# Patient Record
Sex: Female | Born: 1965 | Race: White | Hispanic: No | Marital: Married | Smoking: Never smoker
Health system: Southern US, Community
[De-identification: ages and names within clinical notes are randomized; demographics above are authoritative.]

## PROBLEM LIST (undated history)

## (undated) DIAGNOSIS — M1990 Unspecified osteoarthritis, unspecified site: Secondary | ICD-10-CM

## (undated) DIAGNOSIS — Z9289 Personal history of other medical treatment: Secondary | ICD-10-CM

## (undated) DIAGNOSIS — D509 Iron deficiency anemia, unspecified: Secondary | ICD-10-CM

## (undated) DIAGNOSIS — R0989 Other specified symptoms and signs involving the circulatory and respiratory systems: Secondary | ICD-10-CM

## (undated) DIAGNOSIS — R002 Palpitations: Secondary | ICD-10-CM

## (undated) DIAGNOSIS — Z91148 Patient's other noncompliance with medication regimen for other reason: Secondary | ICD-10-CM

## (undated) DIAGNOSIS — E785 Hyperlipidemia, unspecified: Secondary | ICD-10-CM

## (undated) DIAGNOSIS — I10 Essential (primary) hypertension: Secondary | ICD-10-CM

## (undated) DIAGNOSIS — Z9114 Patient's other noncompliance with medication regimen: Secondary | ICD-10-CM

## (undated) DIAGNOSIS — E669 Obesity, unspecified: Secondary | ICD-10-CM

## (undated) HISTORY — DX: Palpitations: R00.2

## (undated) HISTORY — DX: Patient's other noncompliance with medication regimen for other reason: Z91.148

## (undated) HISTORY — DX: Unspecified osteoarthritis, unspecified site: M19.90

## (undated) HISTORY — DX: Hyperlipidemia, unspecified: E78.5

## (undated) HISTORY — DX: Other specified symptoms and signs involving the circulatory and respiratory systems: R09.89

## (undated) HISTORY — PX: WISDOM TOOTH EXTRACTION: SHX21

## (undated) HISTORY — DX: Obesity, unspecified: E66.9

## (undated) HISTORY — DX: Patient's other noncompliance with medication regimen: Z91.14

## (undated) HISTORY — DX: Personal history of other medical treatment: Z92.89

## (undated) HISTORY — DX: Iron deficiency anemia, unspecified: D50.9

## (undated) HISTORY — DX: Essential (primary) hypertension: I10

---

## 1999-03-20 ENCOUNTER — Other Ambulatory Visit: Admission: RE | Admit: 1999-03-20 | Discharge: 1999-03-20 | Payer: Self-pay | Admitting: Family Medicine

## 1999-10-07 ENCOUNTER — Ambulatory Visit (HOSPITAL_COMMUNITY): Admission: RE | Admit: 1999-10-07 | Discharge: 1999-10-07 | Payer: Self-pay | Admitting: Family Medicine

## 1999-10-07 ENCOUNTER — Encounter: Payer: Self-pay | Admitting: Family Medicine

## 1999-12-04 ENCOUNTER — Encounter: Payer: Self-pay | Admitting: Family Medicine

## 1999-12-04 ENCOUNTER — Ambulatory Visit (HOSPITAL_COMMUNITY): Admission: RE | Admit: 1999-12-04 | Discharge: 1999-12-04 | Payer: Self-pay | Admitting: Family Medicine

## 1999-12-24 ENCOUNTER — Ambulatory Visit (HOSPITAL_COMMUNITY): Admission: RE | Admit: 1999-12-24 | Discharge: 1999-12-24 | Payer: Self-pay | Admitting: Neurosurgery

## 1999-12-24 ENCOUNTER — Encounter: Payer: Self-pay | Admitting: Neurosurgery

## 2000-01-08 ENCOUNTER — Ambulatory Visit (HOSPITAL_COMMUNITY): Admission: RE | Admit: 2000-01-08 | Discharge: 2000-01-08 | Payer: Self-pay | Admitting: Neurosurgery

## 2000-10-07 ENCOUNTER — Other Ambulatory Visit: Admission: RE | Admit: 2000-10-07 | Discharge: 2000-10-07 | Payer: Self-pay | Admitting: Family Medicine

## 2000-10-14 ENCOUNTER — Encounter: Admission: RE | Admit: 2000-10-14 | Discharge: 2000-10-14 | Payer: Self-pay | Admitting: Family Medicine

## 2000-10-14 ENCOUNTER — Encounter: Payer: Self-pay | Admitting: Family Medicine

## 2001-10-07 ENCOUNTER — Other Ambulatory Visit: Admission: RE | Admit: 2001-10-07 | Discharge: 2001-10-07 | Payer: Self-pay | Admitting: Family Medicine

## 2001-11-15 ENCOUNTER — Ambulatory Visit (HOSPITAL_COMMUNITY): Admission: RE | Admit: 2001-11-15 | Discharge: 2001-11-15 | Payer: Self-pay | Admitting: Neurosurgery

## 2001-11-15 ENCOUNTER — Encounter: Payer: Self-pay | Admitting: Neurosurgery

## 2002-10-11 ENCOUNTER — Other Ambulatory Visit: Admission: RE | Admit: 2002-10-11 | Discharge: 2002-10-11 | Payer: Self-pay | Admitting: Family Medicine

## 2003-07-25 ENCOUNTER — Ambulatory Visit (HOSPITAL_COMMUNITY): Admission: RE | Admit: 2003-07-25 | Discharge: 2003-07-25 | Payer: Self-pay | Admitting: Family Medicine

## 2003-08-05 DIAGNOSIS — E669 Obesity, unspecified: Secondary | ICD-10-CM

## 2003-08-05 DIAGNOSIS — Z9289 Personal history of other medical treatment: Secondary | ICD-10-CM

## 2003-08-05 HISTORY — DX: Obesity, unspecified: E66.9

## 2003-08-05 HISTORY — DX: Personal history of other medical treatment: Z92.89

## 2003-11-07 ENCOUNTER — Other Ambulatory Visit: Admission: RE | Admit: 2003-11-07 | Discharge: 2003-11-07 | Payer: Self-pay | Admitting: Family Medicine

## 2004-07-09 ENCOUNTER — Ambulatory Visit: Payer: Self-pay | Admitting: Family Medicine

## 2004-08-04 DIAGNOSIS — Z9289 Personal history of other medical treatment: Secondary | ICD-10-CM

## 2004-08-04 HISTORY — DX: Personal history of other medical treatment: Z92.89

## 2006-11-02 ENCOUNTER — Ambulatory Visit (HOSPITAL_COMMUNITY): Admission: RE | Admit: 2006-11-02 | Discharge: 2006-11-02 | Payer: Self-pay | Admitting: Family Medicine

## 2006-12-09 ENCOUNTER — Encounter: Admission: RE | Admit: 2006-12-09 | Discharge: 2006-12-09 | Payer: Self-pay | Admitting: Family Medicine

## 2008-03-19 IMAGING — CT CT ABD/PELVIS
3 of 6 series · 17 of 46 positions shown, 19 images · IV contrast (READICAT/WATER & [ID] OMNI 300)
Comparison: none

CLINICAL DATA: Severe abdominal pain.   Back pain. 

 ABDOMEN CT WITHOUT AND WITH CONTRAST:
TECHNIQUE: Multidetector CT imaging of the abdomen was performed both before and during bolus administration of intravenous contrast.
 Contrast:  100 cc Omnipaque 300
TECHNIQUE: Multidetector CT imaging of the pelvis was performed following the standard protocol during bolus administration of intravenous contrast.

[Series 3: abdomen w/ · axial · 0.82mm/px · z∈[-375,+5]mm · 12 of 90 slices shown, 14 images]
[im 7/90  soft-tissue]
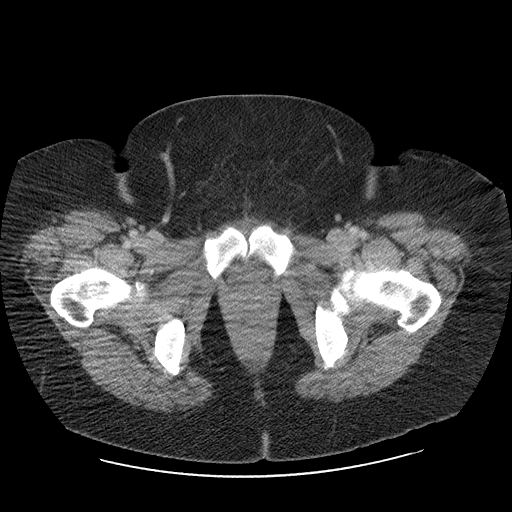
[im 7/90  bone]
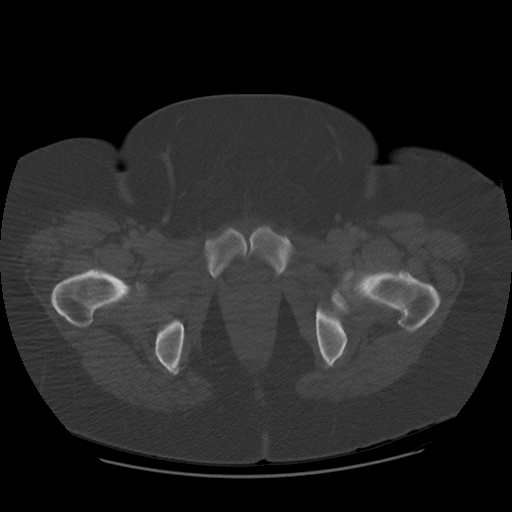
[im 14/90  soft-tissue]
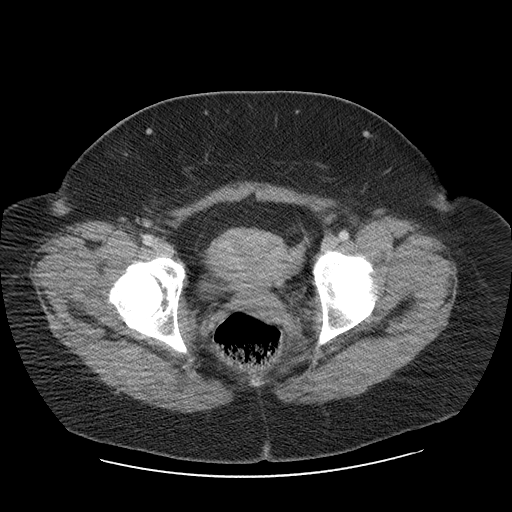
[im 21/90  soft-tissue]
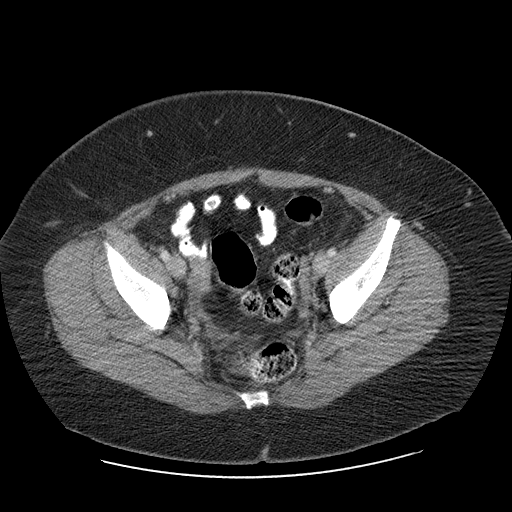
[im 28/90  soft-tissue]
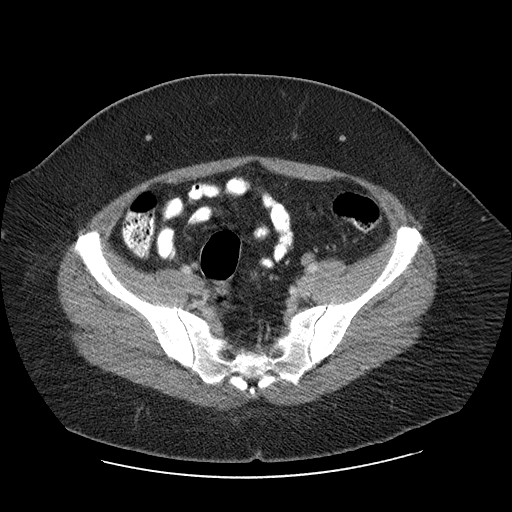
[im 35/90  soft-tissue]
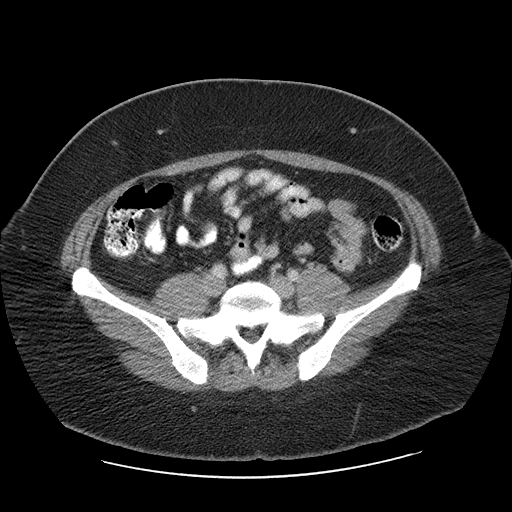
[im 42/90  soft-tissue]
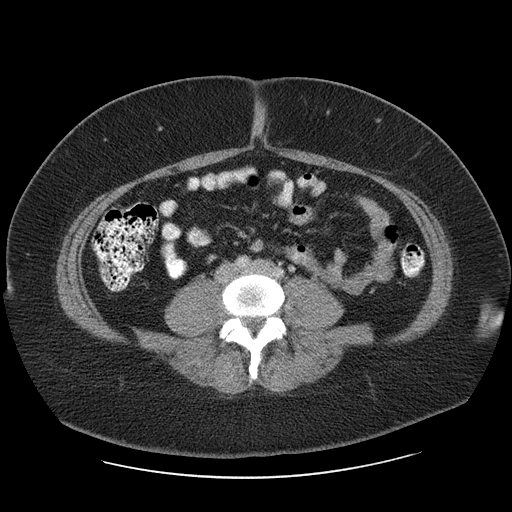
[im 48/90  soft-tissue]
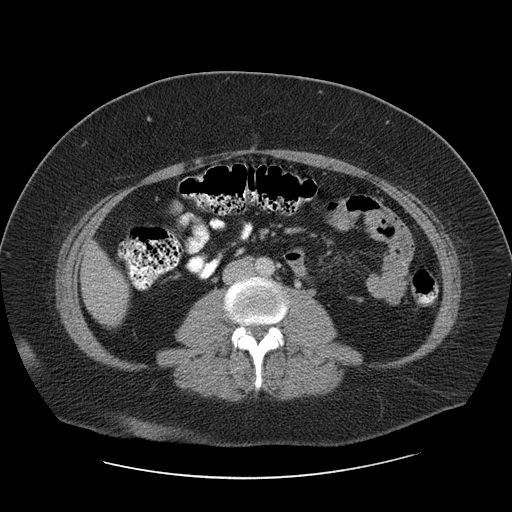
[im 55/90  soft-tissue]
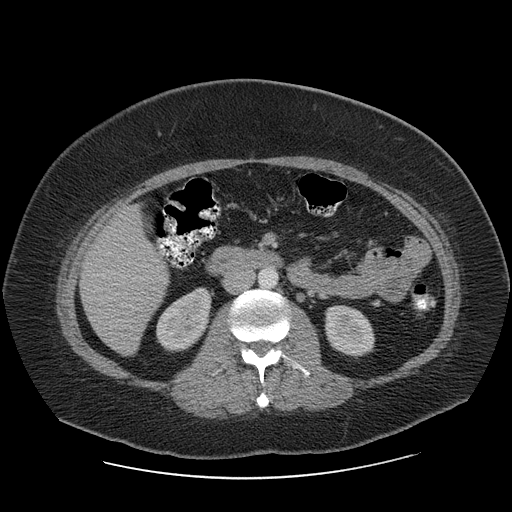
[im 62/90  soft-tissue]
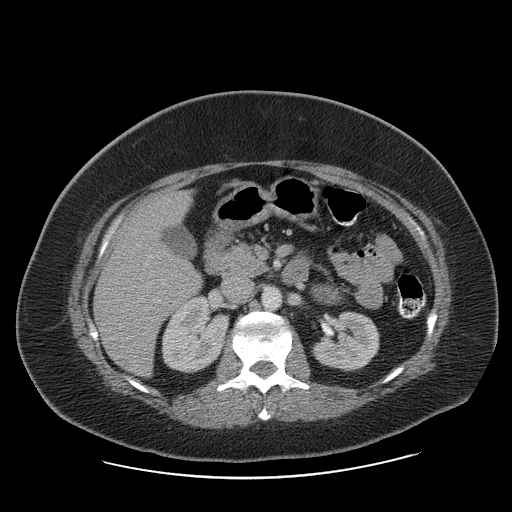
[im 62/90  bone]
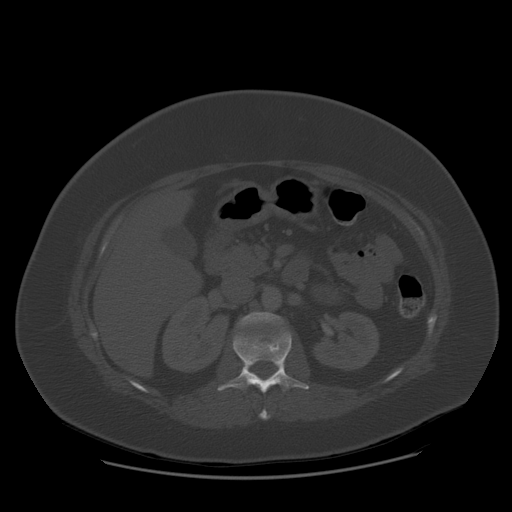
[im 69/90  soft-tissue]
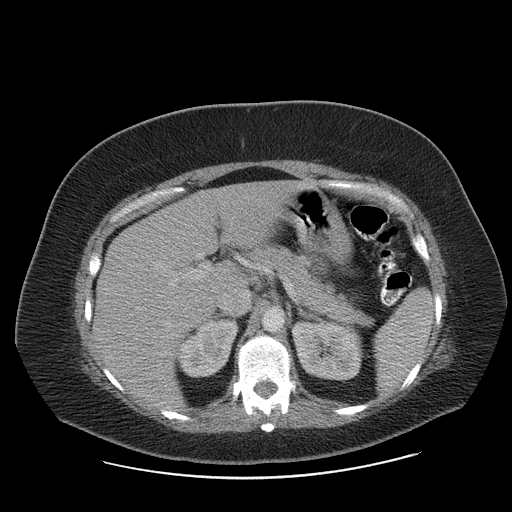
[im 76/90  soft-tissue]
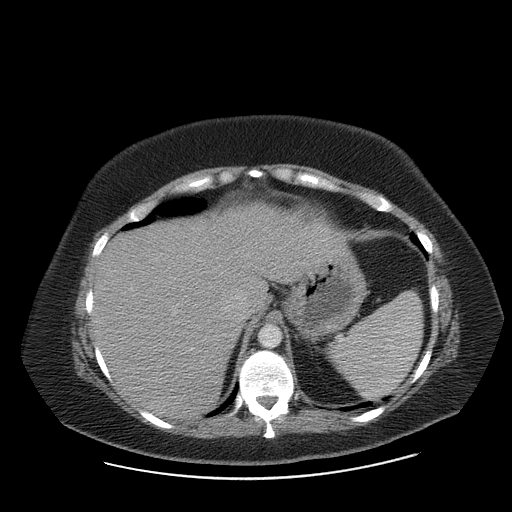
[im 83/90  soft-tissue]
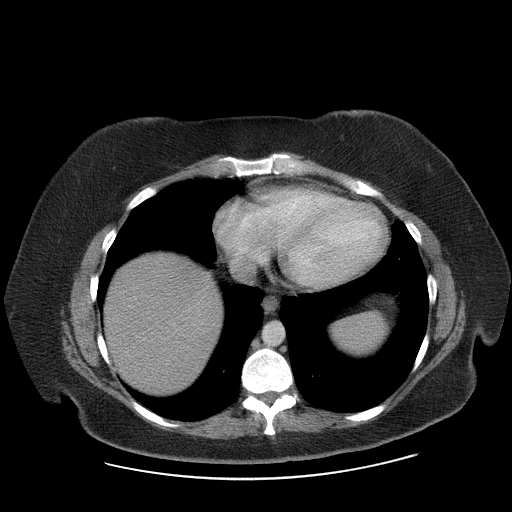

[Series 4: lung windows · axial · 0.82mm/px · z∈[-80,-40]mm · 2 of 32 slices shown]
[im 8/32  bone]
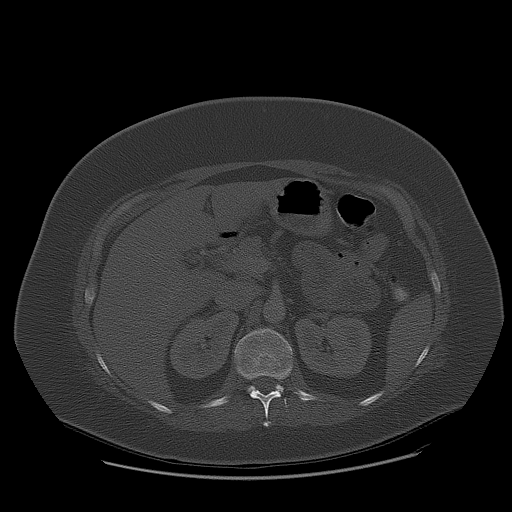
[im 16/32  bone]
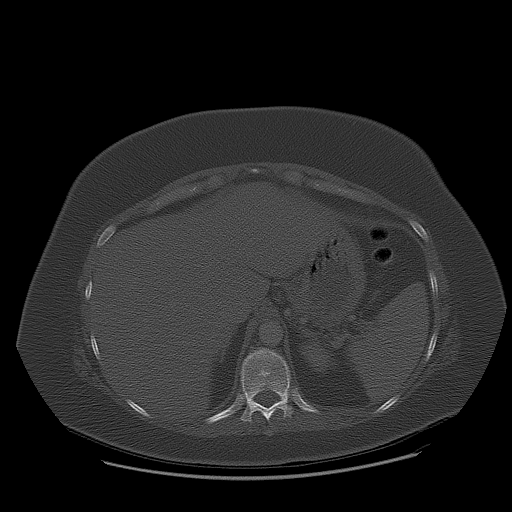

[Series 500: coronal · coronal · 0.92mm/px · 3 of 127 slices shown]
[im 43/127  soft-tissue]
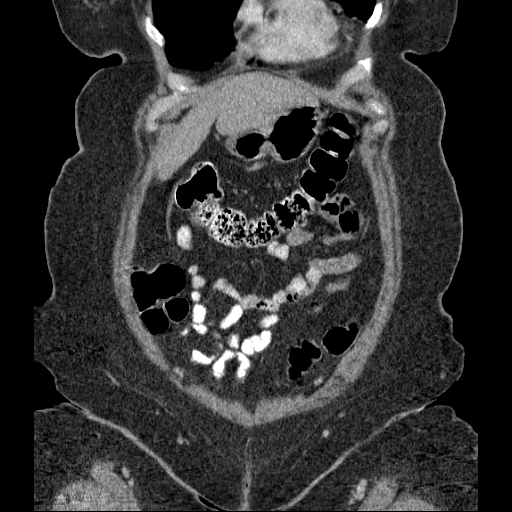
[im 57/127  soft-tissue]
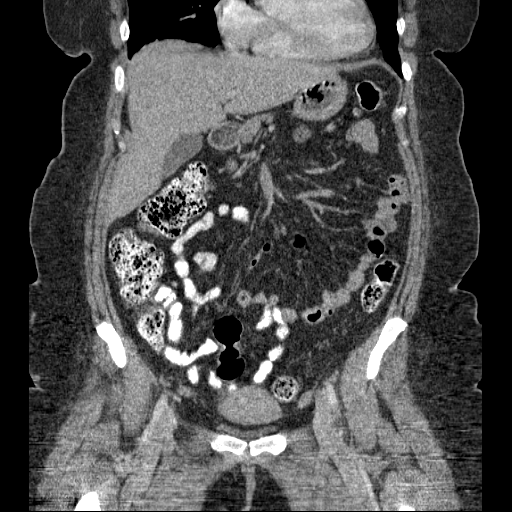
[im 71/127  soft-tissue]
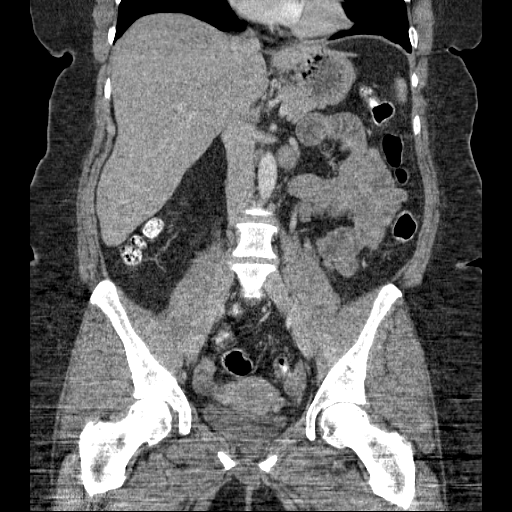

[17 of 46 positions shown; findings below may reference images not displayed]

FINDINGS: The liver, gallbladder, adrenal glands, kidneys, spleen, pancreas, stomach and small bowel are unremarkable.  No pathologically enlarged lymph nodes.  No free fluid.
IMPRESSION: No acute findings. 

 PELVIS CT WITH CONTRAST:
FINDINGS: The uterus and ovaries are visualized.  Colon unremarkable.  No pathologically enlarged lymph nodes.  No free fluid.  No worrisome lytic or sclerotic lesion.
IMPRESSION: No acute findings.

## 2008-08-04 DIAGNOSIS — D509 Iron deficiency anemia, unspecified: Secondary | ICD-10-CM

## 2008-08-04 HISTORY — DX: Iron deficiency anemia, unspecified: D50.9

## 2011-10-03 DIAGNOSIS — R0989 Other specified symptoms and signs involving the circulatory and respiratory systems: Secondary | ICD-10-CM

## 2011-10-03 HISTORY — DX: Other specified symptoms and signs involving the circulatory and respiratory systems: R09.89

## 2013-04-18 ENCOUNTER — Encounter: Payer: Self-pay | Admitting: *Deleted

## 2013-04-25 ENCOUNTER — Ambulatory Visit: Payer: Self-pay | Admitting: Cardiology

## 2013-05-17 ENCOUNTER — Encounter: Payer: Self-pay | Admitting: Cardiovascular Disease

## 2013-05-27 ENCOUNTER — Other Ambulatory Visit: Payer: Self-pay

## 2013-05-27 ENCOUNTER — Other Ambulatory Visit: Payer: Self-pay | Admitting: Cardiovascular Disease

## 2013-05-27 ENCOUNTER — Telehealth: Payer: Self-pay | Admitting: Cardiovascular Disease

## 2013-05-27 MED ORDER — LISINOPRIL 20 MG PO TABS: ORAL_TABLET | ORAL | Status: DC

## 2013-05-27 MED ORDER — OMEPRAZOLE 20 MG PO CPDR: DELAYED_RELEASE_CAPSULE | ORAL | Status: DC

## 2013-05-27 MED ORDER — SIMVASTATIN 40 MG PO TABS: ORAL_TABLET | ORAL | Status: DC

## 2013-05-27 NOTE — Telephone Encounter (Signed)
Rx was sent to pharmacy electronically. 

## 2013-05-27 NOTE — Telephone Encounter (Signed)
Returned call.  Left message asking that hardy copy request be faxed to [phone] for refills or send electronically. 

## 2013-05-27 NOTE — Telephone Encounter (Signed)
Need refills on Lisinipril 20 mg #30,Simvastatin 40 mg #30 and Omeprazole 20 mg #30.

## 2013-06-01 ENCOUNTER — Ambulatory Visit (INDEPENDENT_AMBULATORY_CARE_PROVIDER_SITE_OTHER): Payer: BC Managed Care – PPO | Admitting: Cardiology

## 2013-06-01 ENCOUNTER — Encounter: Payer: Self-pay | Admitting: Cardiology

## 2013-06-01 VITALS — BP 140/86 | HR 90 | Ht 62.0 in | Wt 267.0 lb

## 2013-06-01 DIAGNOSIS — R002 Palpitations: Secondary | ICD-10-CM | POA: Insufficient documentation

## 2013-06-01 DIAGNOSIS — E669 Obesity, unspecified: Secondary | ICD-10-CM

## 2013-06-01 DIAGNOSIS — I10 Essential (primary) hypertension: Secondary | ICD-10-CM

## 2013-06-01 DIAGNOSIS — D509 Iron deficiency anemia, unspecified: Secondary | ICD-10-CM

## 2013-06-01 DIAGNOSIS — Z9114 Patient's other noncompliance with medication regimen: Secondary | ICD-10-CM

## 2013-06-01 DIAGNOSIS — R0989 Other specified symptoms and signs involving the circulatory and respiratory systems: Secondary | ICD-10-CM

## 2013-06-01 DIAGNOSIS — E785 Hyperlipidemia, unspecified: Secondary | ICD-10-CM

## 2013-06-01 DIAGNOSIS — Z9119 Patient's noncompliance with other medical treatment and regimen: Secondary | ICD-10-CM

## 2013-06-01 DIAGNOSIS — Z9289 Personal history of other medical treatment: Secondary | ICD-10-CM | POA: Insufficient documentation

## 2013-06-01 DIAGNOSIS — Z9189 Other specified personal risk factors, not elsewhere classified: Secondary | ICD-10-CM

## 2013-06-01 DIAGNOSIS — M1990 Unspecified osteoarthritis, unspecified site: Secondary | ICD-10-CM

## 2013-06-01 DIAGNOSIS — Z91148 Patient's other noncompliance with medication regimen for other reason: Secondary | ICD-10-CM

## 2013-06-01 DIAGNOSIS — Z789 Other specified health status: Secondary | ICD-10-CM

## 2013-06-01 MED ORDER — LISINOPRIL 20 MG PO TABS: ORAL_TABLET | ORAL | Status: DC

## 2013-06-01 MED ORDER — POLYSACCHARIDE IRON COMPLEX 150 MG PO CAPS: 150.0000 mg | ORAL_CAPSULE | Freq: Every day | ORAL | Status: DC

## 2013-06-01 MED ORDER — SIMVASTATIN 40 MG PO TABS: ORAL_TABLET | ORAL | Status: DC

## 2013-06-01 MED ORDER — OMEPRAZOLE 20 MG PO CPDR: DELAYED_RELEASE_CAPSULE | ORAL | Status: DC

## 2013-06-01 MED ORDER — METOPROLOL TARTRATE 25 MG PO TABS: 25.0000 mg | ORAL_TABLET | Freq: Two times a day (BID) | ORAL | Status: DC

## 2013-06-01 MED ORDER — HYDROCHLOROTHIAZIDE 12.5 MG PO CAPS: 12.5000 mg | ORAL_CAPSULE | Freq: Every day | ORAL | Status: DC

## 2013-06-01 NOTE — Assessment & Plan Note (Signed)
She is scheduled to see Dr Loreta Ave

## 2013-06-01 NOTE — Assessment & Plan Note (Signed)
Doing well except for Nu Iron which she only gets every other month

## 2013-06-01 NOTE — Assessment & Plan Note (Signed)
Controlled.  

## 2013-06-01 NOTE — Assessment & Plan Note (Signed)
No significant problems with this

## 2013-06-01 NOTE — Progress Notes (Signed)
06/01/2013 Shelia Clements   [birth year]  [id number]  Primary Physicia No PCP Per Patient Primary Cardiologist: Shelia Clements  HPI:  The patient is a pleasant, 47 year old female who we have seen in the past for hypertension and palpitations as well as dyslipidemia. She has had a low risk Myoview in 2006, and a normal echocardiogram in 2005 . She had a negative sleep study in 2005 but are suspicious she probably has sleep apnea as her BMI is 48. We saw her in the office September 10, 2012, for palpitations and hypertension. She has had problems with higher doses of Coreg in the past. I stopped her Coreg and put her on metoprolol 25 mg b.i.d. and added HCTZ 12.5 mg daily to her current medications. Lab work done Feb 2014 showed her renal function to be normal with a BUN of 12, a creatinine of 0.69, potassium 4.7. TSH is 4.63 which is just outside the high range. The lipid panel shows a cholesterol 142, HDL 47 and LDL 75. Interestingly, her hematology profile showed a hemoglobin of 8.4 and hematocrit of 31. Her MCV was 61. I reviewed her previous records with her and she apparently had a GI evaluation by Shelia. Loreta Clements a couple years ago for iron deficiency anemia. In 2010 her hemoglobin was 11.4. A colonoscopy and endoscopy were recommended but she could not afford the copay and never had that done. She was put on iron supplement but apparently had a reaction to ferrous sulfate and this was changed to Nu-Iron. She could tolerate the Nu-Iron but could not afford the copay.       She is here today for a 6 month follow up. She has done well from a cardiac standpoint with rare palpitations. She is able to get her medications except for the Nu Iron which she takes every other month because of cost. She works at [employer].           Current Outpatient Prescriptions  Medication Sig Dispense Refill  . hydrochlorothiazide (MICROZIDE) 12.5 MG capsule Take 1 capsule (12.5 mg total) by mouth daily.  30 capsule  6  .  iron polysaccharides (NU-IRON) 150 MG capsule Take 1 capsule (150 mg total) by mouth daily.  30 capsule  6  . lisinopril (PRINIVIL,ZESTRIL) 20 MG tablet Take 1 tablet (20 mg total) by mouth once daily.  30 tablet  6  . metoprolol tartrate (LOPRESSOR) 25 MG tablet Take 1 tablet (25 mg total) by mouth 2 (two) times daily.  30 tablet  6  . omeprazole (PRILOSEC) 20 MG capsule Take 1 capsule (20 mg total) by mouth daily.  30 capsule  6  . simvastatin (ZOCOR) 40 MG tablet Take 1 tablet (40 mg total) by mouth at bedtime.  30 tablet  0   No current facility-administered medications for this visit.    Allergies  Allergen Reactions  . Ferrous Sulfate   . Penicillins   . Sulfa Antibiotics     History   Social History  . Marital Status: Single    Spouse Name: N/A    Number of Children: N/A  . Years of Education: N/A   Occupational History  . Not on file.   Social History Main Topics  . Smoking status: Never Smoker   . Smokeless tobacco: Not on file  . Alcohol Use: Not on file  . Drug Use: Not on file  . Sexual Activity: Not on file   Other Topics Concern  . Not on file  Social History Narrative  . No narrative on file     Review of Systems: General: negative for chills, fever, night sweats or weight changes.  Cardiovascular: negative for chest pain, dyspnea on exertion, edema, orthopnea, palpitations, paroxysmal nocturnal dyspnea or shortness of breath Dermatological: negative for rash Respiratory: negative for cough or wheezing Urologic: negative for hematuria Abdominal: negative for nausea, vomiting, diarrhea, bright red blood per rectum, melena, or hematemesis Neurologic: negative for visual changes, syncope, or dizziness All other systems reviewed and are otherwise negative except as noted above.    Blood pressure 140/86, pulse 90, height 5\' 2"  (1.575 m), weight 267 lb (121.11 kg).  General appearance: alert, cooperative, no distress and morbidly obese Lungs: clear to  auscultation bilaterally Heart: regular rate and rhythm Neck- soft carotid bruit on Lt  EKG NSR  ASSESSMENT AND PLAN:   Hypertension Controlled  Palpitation No significant problems with this  Dyslipidemia Due for lipids  Obesity- BMI 48 We again discussed the importance of wgt loss and wgt loss strategies.  Iron deficiency anemia She is scheduled to see Shelia Clements  Noncompliance with medications secondary to costs Doing well except for Nu Iron which she only gets every other month   PLAN  We had a discussion about her weight. She drinks milk "like water" and admits to "binge eating". She knows she needs to walk for exercise but is not regular about this. She would benefit from an intense weight loss program but I doubt she would agree to this. I'll bring it up again at her next visit. I did order labs including a Hgb A1c (obesity, family history of DM), a follow up TSH, CMET, and CBC. We'll see her back in 6 months or sooner if there is a problem with her labs. She has an appointment with Shelia Clements and I encouraged her to keep this.  Shelia Clements KPA-C 06/01/2013 3:05 PM

## 2013-06-01 NOTE — Assessment & Plan Note (Signed)
We again discussed the importance of wgt loss and wgt loss strategies.

## 2013-06-01 NOTE — Assessment & Plan Note (Signed)
Due for lipids.  

## 2013-06-01 NOTE — Patient Instructions (Signed)
Your physician recommends that you return for lab work, please fast before lab Your physician recommends that you schedule a follow-up appointment in: 6  months

## 2013-06-10 ENCOUNTER — Telehealth: Payer: Self-pay | Admitting: Cardiology

## 2013-06-10 NOTE — Telephone Encounter (Signed)
Patient called back and states that se spoke with [facility] and was told that it would be better if a nurse called for her lab results.  We need to call customer service @ [phone].

## 2013-06-10 NOTE — Telephone Encounter (Signed)
Would like lab results from Monday(06-06-13)please.

## 2013-06-10 NOTE — Telephone Encounter (Signed)
Returned call and spoke w/ pt.  Informed results have not been received.  Pt stated she went to [facility] in [place].  Pt informed there may be a delay w/ resulting or they faxed results.  Informed RN will notify Medical Records to check for lab results so provider can be notified.  Pt verbalized understanding and agreed w/ plan.  Pt will also call [facility] to make sure results faxed.  Medical Records notified to look for results.

## 2013-06-13 NOTE — Telephone Encounter (Signed)
Call to [facility] and informed results just reported and will fax now.  MR notified to scan results into [facility] Shelter, PA-C[facility] [place] when received.  Returned call.  No answer/voicemail.  Will try later.

## 2013-06-13 NOTE — Telephone Encounter (Signed)
Returning Amber call

## 2013-06-13 NOTE — Telephone Encounter (Signed)
Returned call and pt informed [facility] faxed results this AM and she will be notified once reviewed by PA.  Pt verbalized understanding and agreed w/ plan.

## 2013-06-15 LAB — TSH: TSH: 3.42 u[IU]/mL (ref 0.450–4.500)

## 2013-06-15 LAB — COMPREHENSIVE METABOLIC PANEL
ALT: 13 IU/L (ref 0–32)
AST: 13 IU/L (ref 0–40)
Albumin/Globulin Ratio: 1.4 (ref 1.1–2.5)
Albumin: 4 g/dL (ref 3.5–5.5)
Alkaline Phosphatase: 100 IU/L (ref 39–117)
BUN/Creatinine Ratio: 21 (ref 9–23)
BUN: 13 mg/dL (ref 6–24)
CO2: 23 mmol/L (ref 18–29)
Calcium: 9.5 mg/dL (ref 8.7–10.2)
Chloride: 101 mmol/L (ref 97–108)
Creatinine, Ser: 0.63 mg/dL (ref 0.57–1.00)
GFR calc Af Amer: 125 mL/min/{1.73_m2} (ref >59)
GFR calc non Af Amer: 109 mL/min/{1.73_m2} (ref >59)
Globulin, Total: 2.8 g/dL (ref 1.5–4.5)
Glucose: 109 mg/dL — ABNORMAL HIGH (ref 65–99)
Potassium: 4.9 mmol/L (ref 3.5–5.2)
Sodium: 140 mmol/L (ref 134–144)
Total Bilirubin: 0.3 mg/dL (ref 0.0–1.2)
Total Protein: 6.8 g/dL (ref 6.0–8.5)

## 2013-06-15 LAB — LIPID PANEL W/O CHOL/HDL RATIO
Cholesterol, Total: 150 mg/dL (ref 100–199)
HDL: 46 mg/dL (ref >39)
LDL Calculated: 77 mg/dL (ref 0–99)
Triglycerides: 137 mg/dL (ref 0–149)
VLDL Cholesterol Cal: 27 mg/dL (ref 5–40)

## 2013-06-16 ENCOUNTER — Telehealth: Payer: Self-pay | Admitting: Cardiovascular Disease

## 2013-06-16 DIAGNOSIS — R7309 Other abnormal glucose: Secondary | ICD-10-CM

## 2013-06-16 DIAGNOSIS — Z79899 Other long term (current) drug therapy: Secondary | ICD-10-CM

## 2013-06-16 NOTE — Progress Notes (Signed)
Called no answer

## 2013-06-16 NOTE — Telephone Encounter (Signed)
Returned call and pt verified x 2.  Stated someone called her this morning and she thinks it was about her lab results.  Chart reviewed and documentation of call not found, but results reviewed.  Pt informed of results per PA and verbalized understanding.  Pt concerned her blood count was normal b/c she feels tired all of the time.  Pt informed blood count was not drawn per results.  Reviewed OV note and PA ordered CBC w/ diff and Hgb A1C, neither drawn.  Labs reordered and faxed to [facility] in [place] ([address]).  Pt will go Monday to have labs drawn.  Message forwarded to L. Diona Fanti, PA-C (FYI).

## 2013-06-16 NOTE — Telephone Encounter (Signed)
Returning someone's call from this morning  Not sure who called  Not sure what is about unless its labs  Please return call

## 2013-06-20 NOTE — Progress Notes (Signed)
Pt. Given lab results

## 2013-06-28 ENCOUNTER — Telehealth: Payer: Self-pay | Admitting: Cardiology

## 2013-06-28 DIAGNOSIS — R7309 Other abnormal glucose: Secondary | ICD-10-CM

## 2013-06-28 DIAGNOSIS — Z79899 Other long term (current) drug therapy: Secondary | ICD-10-CM

## 2013-06-28 NOTE — Telephone Encounter (Signed)
Returned call and pt verified x 2.  Pt stated [facility] told her their policy changed and she will have to pay money down for tests in case it's not covered by her insurance or put a credit card on file.  Pt stated she doesn't have the money and doesn't have a credit card.  Pt wants to go to [facility] to have labs drawn.  Informed labs will be reordered for [facility] and she will need to present to lab to have drawn.  Returned call and pt verified x 2.

## 2013-06-28 NOTE — Telephone Encounter (Signed)
Pt wants to talk to Amber-co.ncerning her lab work

## 2013-08-01 ENCOUNTER — Telehealth: Payer: Self-pay | Admitting: Cardiovascular Disease

## 2013-08-01 MED ORDER — SIMVASTATIN 40 MG PO TABS: ORAL_TABLET | ORAL | Status: DC

## 2013-08-01 NOTE — Telephone Encounter (Signed)
Refill request not received.    Returned call and pt verified x 2.  Pt informed message received and request not.  Informed RN will send in refill to last until April appt.  Pt verbalized understanding and agreed w/ plan.  Refill(s) sent to pharmacy.

## 2013-08-01 NOTE — Telephone Encounter (Signed)
Trying to get Simistatin 40mg  filled  [facility] in [place] has submitted twice and no response  Needs to get refilled.  Please call her before 10am or after 3 pm to reach her

## 2013-11-14 ENCOUNTER — Other Ambulatory Visit: Payer: Self-pay | Admitting: *Deleted

## 2013-11-14 MED ORDER — METOPROLOL TARTRATE 25 MG PO TABS: 25.0000 mg | ORAL_TABLET | Freq: Two times a day (BID) | ORAL | Status: DC

## 2013-11-30 ENCOUNTER — Telehealth: Payer: Self-pay | Admitting: Cardiovascular Disease

## 2013-11-30 MED ORDER — LISINOPRIL 20 MG PO TABS: ORAL_TABLET | ORAL | Status: DC

## 2013-11-30 MED ORDER — METOPROLOL TARTRATE 25 MG PO TABS: 25.0000 mg | ORAL_TABLET | Freq: Two times a day (BID) | ORAL | Status: DC

## 2013-11-30 MED ORDER — SIMVASTATIN 40 MG PO TABS: ORAL_TABLET | ORAL | Status: DC

## 2013-11-30 NOTE — Telephone Encounter (Signed)
Patient notified that refills are sent to pharmacy

## 2013-11-30 NOTE — Telephone Encounter (Signed)
Appt with luke on 5/26.  Needs refills on lisinopril and simvastatin to last til then.Marland Kitchen.Marland Kitchen.Also needs to ask about her Metoprolol.  DIdn't get enough when refilled last time., [facility] in [place] - [phone].

## 2013-12-27 ENCOUNTER — Encounter: Payer: Self-pay | Admitting: Cardiology

## 2013-12-27 ENCOUNTER — Ambulatory Visit (INDEPENDENT_AMBULATORY_CARE_PROVIDER_SITE_OTHER): Payer: BC Managed Care – PPO | Admitting: Cardiology

## 2013-12-27 VITALS — BP 140/86 | HR 81 | Ht 62.0 in | Wt 257.9 lb

## 2013-12-27 DIAGNOSIS — I10 Essential (primary) hypertension: Secondary | ICD-10-CM

## 2013-12-27 DIAGNOSIS — Z9114 Patient's other noncompliance with medication regimen: Secondary | ICD-10-CM

## 2013-12-27 DIAGNOSIS — Z9119 Patient's noncompliance with other medical treatment and regimen: Secondary | ICD-10-CM

## 2013-12-27 DIAGNOSIS — E669 Obesity, unspecified: Secondary | ICD-10-CM

## 2013-12-27 DIAGNOSIS — Z91148 Patient's other noncompliance with medication regimen for other reason: Secondary | ICD-10-CM

## 2013-12-27 DIAGNOSIS — Z91199 Patient's noncompliance with other medical treatment and regimen due to unspecified reason: Secondary | ICD-10-CM

## 2013-12-27 DIAGNOSIS — D649 Anemia, unspecified: Secondary | ICD-10-CM

## 2013-12-27 DIAGNOSIS — Z79899 Other long term (current) drug therapy: Secondary | ICD-10-CM

## 2013-12-27 DIAGNOSIS — Z833 Family history of diabetes mellitus: Secondary | ICD-10-CM

## 2013-12-27 DIAGNOSIS — R0989 Other specified symptoms and signs involving the circulatory and respiratory systems: Secondary | ICD-10-CM

## 2013-12-27 DIAGNOSIS — D509 Iron deficiency anemia, unspecified: Secondary | ICD-10-CM

## 2013-12-27 DIAGNOSIS — R0600 Dyspnea, unspecified: Secondary | ICD-10-CM | POA: Insufficient documentation

## 2013-12-27 DIAGNOSIS — R0609 Other forms of dyspnea: Secondary | ICD-10-CM

## 2013-12-27 DIAGNOSIS — R002 Palpitations: Secondary | ICD-10-CM

## 2013-12-27 LAB — IRON: Iron: 12 ug/dL — ABNORMAL LOW (ref 42–145)

## 2013-12-27 LAB — CBC WITH DIFFERENTIAL/PLATELET
Basophils Absolute: 0 10*3/uL (ref 0.0–0.1)
Basophils Relative: 0 % (ref 0–1)
Eosinophils Absolute: 0.3 10*3/uL (ref 0.0–0.7)
Eosinophils Relative: 3 % (ref 0–5)
HCT: 26.6 % — ABNORMAL LOW (ref 36.0–46.0)
Hemoglobin: 7.3 g/dL — ABNORMAL LOW (ref 12.0–15.0)
Lymphocytes Relative: 23 % (ref 12–46)
Lymphs Abs: 2.1 10*3/uL (ref 0.7–4.0)
MCH: 15.8 pg — ABNORMAL LOW (ref 26.0–34.0)
MCHC: 27.4 g/dL — ABNORMAL LOW (ref 30.0–36.0)
MCV: 57.6 fL — ABNORMAL LOW (ref 78.0–100.0)
Monocytes Absolute: 0.7 10*3/uL (ref 0.1–1.0)
Monocytes Relative: 8 % (ref 3–12)
Neutro Abs: 6 10*3/uL (ref 1.7–7.7)
Neutrophils Relative %: 66 % (ref 43–77)
Platelets: 449 10*3/uL — ABNORMAL HIGH (ref 150–400)
RBC: 4.62 MIL/uL (ref 3.87–5.11)
RDW: 20.4 % — ABNORMAL HIGH (ref 11.5–15.5)
WBC: 9.1 10*3/uL (ref 4.0–10.5)

## 2013-12-27 LAB — BASIC METABOLIC PANEL
BUN: 10 mg/dL (ref 6–23)
CO2: 27 mEq/L (ref 19–32)
Calcium: 9.2 mg/dL (ref 8.4–10.5)
Chloride: 102 mEq/L (ref 96–112)
Creat: 0.57 mg/dL (ref 0.50–1.10)
Glucose, Bld: 88 mg/dL (ref 70–99)
Potassium: 5 mEq/L (ref 3.5–5.3)
Sodium: 138 mEq/L (ref 135–145)

## 2013-12-27 LAB — HEMOGLOBIN A1C
Hgb A1c MFr Bld: 6.6 % — ABNORMAL HIGH (ref <5.7)
Mean Plasma Glucose: 143 mg/dL — ABNORMAL HIGH (ref <117)

## 2013-12-27 MED ORDER — SIMVASTATIN 40 MG PO TABS: ORAL_TABLET | ORAL | Status: DC

## 2013-12-27 MED ORDER — POLYSACCHARIDE IRON COMPLEX 150 MG PO CAPS: 150.0000 mg | ORAL_CAPSULE | Freq: Every day | ORAL | Status: DC

## 2013-12-27 MED ORDER — METOPROLOL TARTRATE 25 MG PO TABS: 25.0000 mg | ORAL_TABLET | Freq: Two times a day (BID) | ORAL | Status: DC

## 2013-12-27 MED ORDER — OMEPRAZOLE 20 MG PO CPDR: DELAYED_RELEASE_CAPSULE | ORAL | Status: DC

## 2013-12-27 MED ORDER — LISINOPRIL 20 MG PO TABS: ORAL_TABLET | ORAL | Status: DC

## 2013-12-27 MED ORDER — HYDROCHLOROTHIAZIDE 12.5 MG PO CAPS: 12.5000 mg | ORAL_CAPSULE | ORAL | Status: DC | PRN

## 2013-12-27 NOTE — Progress Notes (Signed)
12/27/2013 Shelia Clements   May 20, 1966  [id number]  Primary Physicia No PCP Per Patient Primary Cardiologist: Dr Allyson SabalBerry   HPI:  The patient is a pleasant, 48 year old female who we have seen in the past for hypertension and palpitations as well as dyslipidemia. She has no primary MD. She has had problems with medication compliance because of cost. She had a low risk Myoview in 2006, and a normal echocardiogram in 2005. She had a negative sleep study in 2005 but we are suspicious she probably has sleep apnea as her BMI is 48. I reviewed her previous records with her and she apparently had a GI evaluation by Dr. Loreta AveMann a couple years ago for iron deficiency anemia. In 2010 her hemoglobin was 11.4. A colonoscopy and endoscopy were recommended but she could not afford the copay and never had that done. She was put on iron supplement but apparently had a reaction to ferrous sulfate and this was changed to Nu-Iron. She could tolerate the Nu-Iron but could not afford the copay. She is in the office for 6 month follow up. Last OV I encouraged her to follow up with [name] but she says she couldn't afford to. Overall she is doing pretty well. She does say she get a sensation of breathlessness before her periods that resolve once her period starts. That is the only time she gets palpitations which she describes as mild.      Current Outpatient Prescriptions  Medication Sig Dispense Refill  . hydrochlorothiazide (MICROZIDE) 12.5 MG capsule Take 1 capsule (12.5 mg total) by mouth as needed.  30 capsule  6  . lisinopril (PRINIVIL,ZESTRIL) 20 MG tablet Take 1 tablet (20 mg total) by mouth once daily.  30 tablet  9  . metoprolol tartrate (LOPRESSOR) 25 MG tablet Take 1 tablet (25 mg total) by mouth 2 (two) times daily.  60 tablet  9  . omeprazole (PRILOSEC) 20 MG capsule Take 1 capsule (20 mg total) by mouth daily.  30 capsule  9  . simvastatin (ZOCOR) 40 MG tablet Take 1 tablet (40 mg total) by mouth at  bedtime.  30 tablet  9  . iron polysaccharides (NU-IRON) 150 MG capsule Take 1 capsule (150 mg total) by mouth daily.  30 capsule  6   No current facility-administered medications for this visit.    Allergies  Allergen Reactions  . Ferrous Sulfate   . Penicillins   . Sulfa Antibiotics     History   Social History  . Marital Status: Single    Spouse Name: N/A    Number of Children: N/A  . Years of Education: N/A   Occupational History  . Not on file.   Social History Main Topics  . Smoking status: Never Smoker   . Smokeless tobacco: Not on file  . Alcohol Use: Not on file  . Drug Use: Not on file  . Sexual Activity: Not on file   Other Topics Concern  . Not on file   Social History Narrative  . No narrative on file     Review of Systems: General: negative for chills, fever, night sweats or weight changes.  Cardiovascular: negative for chest pain, dyspnea on exertion, edema, orthopnea, palpitations, paroxysmal nocturnal dyspnea or shortness of breath Dermatological: negative for rash Respiratory: negative for cough or wheezing Urologic: negative for hematuria Abdominal: negative for nausea, vomiting, diarrhea, bright red blood per rectum, melena, or hematemesis Neurologic: negative for visual changes, syncope, or dizziness She says her  periods have become irregular All other systems reviewed and are otherwise negative except as noted above.    Blood pressure 140/86, pulse 81, height 5\' 2"  (1.575 m), weight 257 lb 14.4 oz (116.983 kg).  General appearance: alert, cooperative, no distress and morbidly obese Lungs: clear to auscultation bilaterally Heart: regular rate and rhythm and soft systolic murmur AOv, and LSB  EKG NSR with NSST changes with TWI 3, AVF, V1- same as previous EKG  ASSESSMENT AND PLAN:   Palpitation This has been under good control  Dyspnea Possibly related to pre menstrual symptoms  Hypertension Controlled  Noncompliance with  medications secondary to costs This continues to be a problem  Obesity- BMI 48 Hx of negative sleep study  Family history of diabetes mellitus in mother The pt has never been evaluated for DM   PLAN  Her labs from November were reviewed, her lipids, CMET, and TSH were all WNL. I ordered labs today- BMP, CBC, serum Fe, and Hgb A1c. I encouraged her to get a primary care provider and suggested she may want to be seen by an OBGYN as well. I did give her an Rx for Nu Iron 150 but asked till we get her labs back before she fill it. We can see her in a year.  Eda Paschal [employer] 12/27/2013 1:03 PM

## 2013-12-27 NOTE — Assessment & Plan Note (Signed)
The pt has never been evaluated for DM

## 2013-12-27 NOTE — Assessment & Plan Note (Signed)
This has been under good control

## 2013-12-27 NOTE — Assessment & Plan Note (Signed)
Hx of negative sleep study

## 2013-12-27 NOTE — Assessment & Plan Note (Signed)
This continues to be a problem

## 2013-12-27 NOTE — Assessment & Plan Note (Signed)
Controlled.  

## 2013-12-27 NOTE — Assessment & Plan Note (Signed)
Possibly related to pre menstrual symptoms

## 2013-12-27 NOTE — Patient Instructions (Signed)
Your physician recommends that you schedule a follow-up appointment in: 1 year  Your physician recommends that you return for lab work   Your physician has recommended you make the following change in your medication: Start Nu-Iron 150 mg daily after result of blood work.

## 2014-01-06 ENCOUNTER — Telehealth: Payer: Self-pay | Admitting: *Deleted

## 2014-01-06 NOTE — Telephone Encounter (Signed)
i talked to pt. About her low hgb and low iron , pt. Stated that she already had a script for iron and but hadn't filled it yet but would start taking it; also she said she couldn't get the endo with Dr. Loreta Ave because she didn't have the $500.00 to pay her deductable but she was aware of her low hgb and iron but she couldn't a lot about it. Stated she would try and come in and get quiac cards sometime next week

## 2014-01-08 NOTE — Progress Notes (Signed)
Agree with stool guaiacs and referral to GI  JJB

## 2014-01-10 NOTE — Progress Notes (Signed)
Pt. Called no answer and no way to leave a message

## 2014-04-11 DIAGNOSIS — E785 Hyperlipidemia, unspecified: Secondary | ICD-10-CM | POA: Insufficient documentation

## 2014-04-11 DIAGNOSIS — Z8639 Personal history of other endocrine, nutritional and metabolic disease: Secondary | ICD-10-CM | POA: Insufficient documentation

## 2014-04-11 DIAGNOSIS — K219 Gastro-esophageal reflux disease without esophagitis: Secondary | ICD-10-CM | POA: Insufficient documentation

## 2014-04-11 DIAGNOSIS — Z87898 Personal history of other specified conditions: Secondary | ICD-10-CM | POA: Insufficient documentation

## 2014-06-20 DIAGNOSIS — E7849 Other hyperlipidemia: Secondary | ICD-10-CM | POA: Insufficient documentation

## 2014-10-31 ENCOUNTER — Telehealth: Payer: Self-pay | Admitting: Cardiology

## 2014-10-31 MED ORDER — LISINOPRIL 20 MG PO TABS: ORAL_TABLET | ORAL | Status: DC

## 2014-10-31 MED ORDER — OMEPRAZOLE 20 MG PO CPDR: DELAYED_RELEASE_CAPSULE | ORAL | Status: DC

## 2014-10-31 MED ORDER — METOPROLOL TARTRATE 25 MG PO TABS: 25.0000 mg | ORAL_TABLET | Freq: Two times a day (BID) | ORAL | Status: DC

## 2014-10-31 MED ORDER — SIMVASTATIN 40 MG PO TABS: ORAL_TABLET | ORAL | Status: DC

## 2014-10-31 NOTE — Telephone Encounter (Signed)
Refills sent, called pt, no answer on phone.

## 2014-10-31 NOTE — Telephone Encounter (Signed)
°  1. Which medications need to be refilled? Lisinopril, Simvastatin, Omeprazole, Metoprolol  2. Which pharmacy is medication to be sent to?[facility] [place]   3. Do they need a 30 day or 90 day supply? 90  4. Would they like a call back once the medication has been sent to the pharmacy? Yes  *SHE STATED THAT SHE WILL NOT BE ABLE TO COME IN FOR AN APPT UNTIL HER HUSBAND'S INSURANCE KICKS IN 90 DAYS*

## 2015-02-13 ENCOUNTER — Encounter: Payer: Self-pay | Admitting: Cardiovascular Disease

## 2015-02-13 ENCOUNTER — Ambulatory Visit (INDEPENDENT_AMBULATORY_CARE_PROVIDER_SITE_OTHER): Payer: Self-pay | Admitting: Cardiovascular Disease

## 2015-02-13 ENCOUNTER — Other Ambulatory Visit: Payer: Self-pay

## 2015-02-13 VITALS — BP 172/78 | HR 96 | Ht 63.0 in | Wt 259.7 lb

## 2015-02-13 DIAGNOSIS — I10 Essential (primary) hypertension: Secondary | ICD-10-CM

## 2015-02-13 DIAGNOSIS — R002 Palpitations: Secondary | ICD-10-CM

## 2015-02-13 DIAGNOSIS — Z79899 Other long term (current) drug therapy: Secondary | ICD-10-CM

## 2015-02-13 DIAGNOSIS — E785 Hyperlipidemia, unspecified: Secondary | ICD-10-CM

## 2015-02-13 DIAGNOSIS — R0989 Other specified symptoms and signs involving the circulatory and respiratory systems: Secondary | ICD-10-CM

## 2015-02-13 MED ORDER — POLYSACCHARIDE IRON COMPLEX 150 MG PO CAPS: 150.0000 mg | ORAL_CAPSULE | Freq: Every day | ORAL | Status: DC

## 2015-02-13 MED ORDER — METOPROLOL TARTRATE 25 MG PO TABS: 25.0000 mg | ORAL_TABLET | Freq: Two times a day (BID) | ORAL | Status: DC

## 2015-02-13 MED ORDER — SIMVASTATIN 40 MG PO TABS: ORAL_TABLET | ORAL | Status: DC

## 2015-02-13 MED ORDER — LISINOPRIL 20 MG PO TABS: ORAL_TABLET | ORAL | Status: DC

## 2015-02-13 MED ORDER — HYDROCHLOROTHIAZIDE 12.5 MG PO CAPS: 12.5000 mg | ORAL_CAPSULE | ORAL | Status: DC | PRN

## 2015-02-13 MED ORDER — OMEPRAZOLE 20 MG PO CPDR: DELAYED_RELEASE_CAPSULE | ORAL | Status: DC

## 2015-02-13 NOTE — Assessment & Plan Note (Signed)
History of hyperlipidemia on simvastatin 40 mg a day. We will recheck a lipid and liver profile 

## 2015-02-13 NOTE — Assessment & Plan Note (Signed)
History of carotid bruit with carotid Dopplers performed several years ago that showed no evidence of ICA stenosis.

## 2015-02-13 NOTE — Patient Instructions (Addendum)
Your physician recommends that you return for lab work at your earliest convenience - FASTING.  Your phyisican recommends that you schedule a blood pressure check with Phillips HayKristin Alvstad, PharmD, in 1 month. If possible, please keep a record of your blood pressures to bring with you to this appointment.  Dr Allyson SabalBerry recommends that you schedule a follow-up appointment in 1 year. You will receive a reminder letter in the mail two months in advance. If you don't receive a letter, please call our office to schedule the follow-up appointment.

## 2015-02-13 NOTE — Assessment & Plan Note (Signed)
Improved on low-dose metoprolol.

## 2015-02-13 NOTE — Telephone Encounter (Signed)
Rx(s) sent to pharmacy electronically.  

## 2015-02-13 NOTE — Assessment & Plan Note (Signed)
History of hypertension with initial blood pressure today measured at 172/86. She is on hydrochlorothiazide, lisinopril and metoprolol. Ordinarily her pressures less than this. Continue current meds at current dosing

## 2015-02-13 NOTE — Progress Notes (Signed)
02/13/2015 Shelia Circleracey Brown Levesque   Jan 30, 1966  [id number]  Primary Physician MCLAUGHLIN, Merleen MillinerMIRIAM K, MD Primary Cardiologist: Runell GessJonathan J. Berry MD Shelia RenoFACP,FACC,FAHA, FSCAI   HPI:  Ms. Shelia Clements is a 49 year old morbidly overweight married Caucasian female with no children who I last saw in the office 3 years ago. She has seen Shelia ShelterLuke Clements [facility] several times since that since then. Her problems include a history of palpitations controlled on low dose beta blocker, hypertension, hyperlipidemia and family history of heart disease. She denies chest pain or shortness of breath. She had a negative Myoview 10 years ago.   Current Outpatient Prescriptions  Medication Sig Dispense Refill  . hydrochlorothiazide (MICROZIDE) 12.5 MG capsule Take 1 capsule (12.5 mg total) by mouth as needed. 30 capsule 6  . iron polysaccharides (NU-IRON) 150 MG capsule Take 1 capsule (150 mg total) by mouth daily. 30 capsule 6  . lisinopril (PRINIVIL,ZESTRIL) 20 MG tablet Take 1 tablet (20 mg total) by mouth once daily. 30 tablet 3  . metoprolol tartrate (LOPRESSOR) 25 MG tablet Take 1 tablet (25 mg total) by mouth 2 (two) times daily. 60 tablet 3  . omeprazole (PRILOSEC) 20 MG capsule Take 1 capsule (20 mg total) by mouth daily. 30 capsule 3  . simvastatin (ZOCOR) 40 MG tablet Take 1 tablet (40 mg total) by mouth at bedtime. 30 tablet 3   No current facility-administered medications for this visit.    Allergies  Allergen Reactions  . Ferrous Sulfate   . Penicillins     Unknown reaction  . Sulfa Antibiotics     Hives     History   Social History  . Marital Status: Married    Spouse Name: N/A  . Number of Children: N/A  . Years of Education: N/A   Occupational History  . Not on file.   Social History Main Topics  . Smoking status: Never Smoker   . Smokeless tobacco: Not on file  . Alcohol Use: Not on file  . Drug Use: Not on file  . Sexual Activity: Not on file   Other Topics Concern  . Not on file     Social History Narrative     Review of Systems: General: negative for chills, fever, night sweats or weight changes.  Cardiovascular: negative for chest pain, dyspnea on exertion, edema, orthopnea, palpitations, paroxysmal nocturnal dyspnea or shortness of breath Dermatological: negative for rash Respiratory: negative for cough or wheezing Urologic: negative for hematuria Abdominal: negative for nausea, vomiting, diarrhea, bright red blood per rectum, melena, or hematemesis Neurologic: negative for visual changes, syncope, or dizziness All other systems reviewed and are otherwise negative except as noted above.    Blood pressure 172/86, pulse 96, height 5\' 3"  (1.6 m), weight 259 lb 11.2 oz (117.799 kg).  General appearance: alert and no distress Neck: no adenopathy, no JVD, supple, symmetrical, trachea midline, thyroid not enlarged, symmetric, no tenderness/mass/nodules and soft bilateral carotid bruits left greater than right Lungs: clear to auscultation bilaterally Heart: soft outflow tract murmur Extremities: extremities normal, atraumatic, no cyanosis or edema  EKG normal sinus rhythm at 96 with nonspecific ST and T-wave changes. I personally reviewed this EKG  ASSESSMENT AND PLAN:   Palpitation Improved on low-dose metoprolol.  Hypertension History of hypertension with initial blood pressure today measured at 172/86. She is on hydrochlorothiazide, lisinopril and metoprolol. Ordinarily her pressures less than this. Continue current meds at current dosing  Dyslipidemia History of hyperlipidemia on simvastatin 40 mg a day. We will  recheck a lipid and liver profile  Carotid bruit- normal dopplers 2013 History of carotid bruit with carotid Dopplers performed several years ago that showed no evidence of ICA stenosis.      Runell Gess MD FACP,FACC,FAHA, [facility] 02/13/2015 12:11 PM

## 2015-10-08 ENCOUNTER — Telehealth: Payer: Self-pay | Admitting: Cardiovascular Disease

## 2015-10-08 NOTE — Telephone Encounter (Signed)
New message      Pt c/o medication issue:  1. Name of Medication: meloxican 2. How are you currently taking this medication (dosage and times per day)? 7.5 mg daily  3. Are you having a reaction (difficulty breathing--STAT)?   not  4. What is your medication issue?  Pt stated that she was having chest pain while on this medication.  She [facility] out of [facility] and got refills [facility].  She thinks she got a different brand of the drug  She stopped taking the medication thursday and on Saturday, she felt better.  We did not prescribe this medication.

## 2015-10-08 NOTE — Telephone Encounter (Signed)
Acknowledged & recommendations relayed to patient.

## 2015-10-08 NOTE — Telephone Encounter (Signed)
Pt had chest pain w/ no other symptoms while taking a new formulation of meloxicam. States no sob, dizziness, radiating arm or jaw pain, etc. Chest pain worse w/ movement and she could rub sternally and improve it. States pain came on w/ meloxicam use and diminished once she stopped taking. Advised patient to seek alternative Rx or OTC recommendation from prescribing physician. Pt wanted guidance on what to do w/ CP. Advised if it returns and she gets symptoms such as SOB, fatigue, dizziness, etc, to go to ER. Gave reassurance regarding recent episode as likely MSK pain.  Routed to [employer] for any further advice.

## 2015-10-08 NOTE — Telephone Encounter (Signed)
Agree; does not sound cardiac

## 2016-02-04 ENCOUNTER — Telehealth: Payer: Self-pay | Admitting: Cardiology

## 2016-02-04 ENCOUNTER — Ambulatory Visit (INDEPENDENT_AMBULATORY_CARE_PROVIDER_SITE_OTHER): Payer: Self-pay | Admitting: Cardiology

## 2016-02-04 ENCOUNTER — Encounter: Payer: Self-pay | Admitting: Cardiology

## 2016-02-04 VITALS — BP 155/78 | HR 97 | Ht 62.0 in | Wt 258.0 lb

## 2016-02-04 DIAGNOSIS — E785 Hyperlipidemia, unspecified: Secondary | ICD-10-CM

## 2016-02-04 DIAGNOSIS — D509 Iron deficiency anemia, unspecified: Secondary | ICD-10-CM

## 2016-02-04 DIAGNOSIS — R0989 Other specified symptoms and signs involving the circulatory and respiratory systems: Secondary | ICD-10-CM

## 2016-02-04 DIAGNOSIS — I10 Essential (primary) hypertension: Secondary | ICD-10-CM

## 2016-02-04 DIAGNOSIS — E669 Obesity, unspecified: Secondary | ICD-10-CM

## 2016-02-04 MED ORDER — OMEPRAZOLE 20 MG PO CPDR: DELAYED_RELEASE_CAPSULE | ORAL | Status: DC

## 2016-02-04 MED ORDER — METOPROLOL TARTRATE 25 MG PO TABS: 25.0000 mg | ORAL_TABLET | Freq: Two times a day (BID) | ORAL | Status: DC

## 2016-02-04 MED ORDER — SIMVASTATIN 40 MG PO TABS: ORAL_TABLET | ORAL | Status: DC

## 2016-02-04 MED ORDER — LISINOPRIL 20 MG PO TABS: ORAL_TABLET | ORAL | Status: DC

## 2016-02-04 MED ORDER — HYDROCHLOROTHIAZIDE 12.5 MG PO CAPS: 12.5000 mg | ORAL_CAPSULE | ORAL | Status: DC | PRN

## 2016-02-04 NOTE — Assessment & Plan Note (Signed)
negative sleep study

## 2016-02-04 NOTE — Telephone Encounter (Signed)
New message       Returning a call to the PA to get test results

## 2016-02-04 NOTE — Progress Notes (Signed)
Cardiology Office Note    Date:  02/04/2016   ID:  Shelia Clements, DOB 06/27/1966, MRN [id number]  PCP:  Shelia ParadiseMCLAUGHLIN, MIRIAM K, MD  Cardiologist:  Dr Shelia Clements  Chief Complaint  Patient presents with  . Follow-up    patient reports no complaints    History of Present Illness:  Shelia Clements is a 50 y.o.obese, Caucasian female who we have seen in the past for hypertension and palpitations as well as dyslipidemia. She is currently be followed by Shelia LittlerLeslie Sharp NP and [facility] in [place]. She has had problems with medication compliance chronically because of cost. 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 She makes too much for Medicaid and can't afford insurance on her own.   She had a low risk Myoview in 2006, and a normal echocardiogram in 2005. She had a negative sleep study in 2005 but we are suspicious she probably has sleep apnea as her BMI is 48. I reviewed her previous records with her. She has chronic Iron deficiency anemia. Her Hgb in Sept 2016 was 6.2. She takes her Iron when she can afford it.  GI evaluation has been recommended in the past but she could not afford the copay and never had that done. She was put on iron supplement but apparently had a reaction to ferrous sulfate and this was changed to Nu-Iron. She could tolerate the Nu-Iron but could not afford the copay. Last OV I encouraged her to try and arrange for a GI evaluation but she says she couldn't afford to.  She is in the office today for routine follow up. She has occasional palpitations. She admits she gets tired and SOB sometimes working in [employer] when  It gets very hoyt. Otherwise she she denies any syncope, melana, or unusual dyspnea. She does say she get a sensation of breathlessness before her periods that resolve once her period starts. That is the only time she gets palpitations which she describes as mild.    Past Medical History  Diagnosis Date  . Hypertension   .  Palpitation   . Dyslipidemia   . History of stress test 2006    Low risk study  . Hx of echocardiogram 2005    normal study  . Obesity 2005    negative sleep study   . Carotid bruit 10/2011    History of carotid bruits in the past with negative dopplers  . Degenerative joint disease   . Iron deficiency anemia 2010    Never had endo/colon ([name])  . Noncompliance with medications     secondary to cost    No past surgical history on file.  Current Medications: Outpatient Prescriptions Prior to Visit  Medication Sig Dispense Refill  . iron polysaccharides (NU-IRON) 150 MG capsule Take 1 capsule (150 mg total) by mouth daily. 30 capsule 11  . hydrochlorothiazide (MICROZIDE) 12.5 MG capsule Take 1 capsule (12.5 mg total) by mouth as needed. 30 capsule 11  . lisinopril (PRINIVIL,ZESTRIL) 20 MG tablet Take 1 tablet (20 mg total) by mouth once daily. 30 tablet 11  . metoprolol tartrate (LOPRESSOR) 25 MG tablet Take 1 tablet (25 mg total) by mouth 2 (two) times daily. 60 tablet 11  . omeprazole (PRILOSEC) 20 MG capsule Take 1 capsule (20 mg total) by mouth daily. 30 capsule 11  . simvastatin (ZOCOR) 40 MG tablet Take 1 tablet (40 mg total) by mouth at bedtime. 30 tablet 11   No facility-administered medications  prior to visit.     Allergies:   Ferrous sulfate; Penicillins; and Sulfa antibiotics   Social History   Social History  . Marital Status: Married    Spouse Name: N/A  . Number of Children: N/A  . Years of Education: N/A   Social History Main Topics  . Smoking status: Never Smoker   . Smokeless tobacco: None  . Alcohol Use: None  . Drug Use: None  . Sexual Activity: Not Asked   Other Topics Concern  . None   Social History Narrative     Family History:  The patient's family history includes Aortic stenosis in her father; CAD in her mother; Cancer in her mother; Diabetes in her mother; Heart attack in her sister; Hypertension in her mother; Stroke in her sister.     ROS:   Please see the history of present illness.    ROS All other systems reviewed and are negative.   PHYSICAL EXAM:   VS:  BP 155/78 mmHg  Pulse 97  Ht  (1.575 m)  Wt 258 lb (117.028 kg)  BMI 47.18 kg/m2   GEN: Well nourished, well developed, in no acute distress HEENT: normal Neck: no JVD, bilateral bruits or masses Cardiac: RRR; soft systolic murmur AOV, LSB, no rubs, or gallops,no edema  Respiratory:  clear to auscultation bilaterally, normal work of breathing GI: soft, nontender, nondistended, + BS MS: no deformity or atrophy Skin: warm and dry, no rash Neuro:  Alert and Oriented x 3, Strength and sensation are intact Psych: euthymic mood, full affect  Wt Readings from Last 3 Encounters:  02/04/16 258 lb (117.028 kg)  02/13/15 259 lb 11.2 oz (117.799 kg)  12/27/13 257 lb 14.4 oz (116.983 kg)      Studies/Labs Reviewed:   EKG:  EKG is ordered today.  The ekg ordered today demonstrates NSR  Recent Labs: No results found for requested labs within last 365 days.   Lipid Panel    Component Value Date/Time   CHOL 150 06/06/2013 0925   TRIG 137 06/06/2013 0925   HDL 46 06/06/2013 0925   LDLCALC 77 06/06/2013 0925      ASSESSMENT:    1. Essential hypertension   2. Iron deficiency anemia   3. Obesity- BMI 48   4. Dyslipidemia   5. Bilateral carotid bruits      PLAN:  In order of problems listed above:  1. Her B/P is fairly well controlled- no change in RX (she can afford these medications) 2. Anemia- I strongly suggested she get this evaluated. I would start by getting a repeat Hgb at her PCP. Ideally she needs a GI and possible a hematology work up.  3. We discussed wgt loss 4. Stable, followed by PCP 5. Normal CA doppler March 2013    Medication Adjustments/Labs and Tests Ordered: Current medicines are reviewed at length with the patient today.  Concerns regarding medicines are outlined above.  Medication changes, Labs and Tests ordered  today are listed in the Patient Instructions below. Patient Instructions  Medication Instructions:  Continue current medications  Labwork: NONE  Testing/Procedures: NONe  Follow-Up: Your physician wants you to follow-up in: 1 Year. You will receive a reminder letter in the mail two months in advance. If you don't receive a letter, please call our office to schedule the follow-up appointment.   Any Other Special Instructions Will Be Listed Below (If Applicable).   If you need a refill on your cardiac medications before your next appointment,  please call your pharmacy.      Signed, Corine ShelterLuke Terrika Zuver, PA-C  02/04/2016 1:31 PM    [facility] [address], [place], [place]  [address] Phone: [phone]; Fax: [phone]

## 2016-02-04 NOTE — Assessment & Plan Note (Signed)
Followed by her PCP- lipids and CMET favorable Sept 2016

## 2016-02-04 NOTE — Assessment & Plan Note (Signed)
History of carotid bruits in the past with negative dopplers

## 2016-02-04 NOTE — Patient Instructions (Signed)
Medication Instructions:  Continue current medications  Labwork: NONE  Testing/Procedures: NONe  Follow-Up: Your physician wants you to follow-up in: 1 Year. You will receive a reminder letter in the mail two months in advance. If you don't receive a letter, please call our office to schedule the follow-up appointment.   Any Other Special Instructions Will Be Listed Below (If Applicable).   If you need a refill on your cardiac medications before your next appointment, please call your pharmacy.

## 2016-02-04 NOTE — Telephone Encounter (Signed)
I called to discuss Ms Shelia Clements's lab results and suggest she get a repeat CBC at her PCP's office.  Shelia ShelterLUKE Yoali Conry PA-C 02/04/2016 4:44 PM

## 2016-02-06 ENCOUNTER — Telehealth: Payer: Self-pay | Admitting: Cardiology

## 2016-02-06 NOTE — Telephone Encounter (Signed)
Called again- no answer. If she calls back I would suggest she have her CBC re checked at her PCP's office. OP GI work up is not going to be possible without it costing her.   Corine ShelterLUKE Mayreli Alden PA-C 02/06/2016 10:59 AM

## 2016-02-14 ENCOUNTER — Encounter: Payer: Self-pay | Admitting: Cardiovascular Disease

## 2016-02-29 ENCOUNTER — Other Ambulatory Visit: Payer: Self-pay | Admitting: Cardiovascular Disease

## 2016-03-03 NOTE — Telephone Encounter (Signed)
Pt calling stating she has requested refill for her NU-Iron to [facility] in [place]-has not been called in yet and she is out-requesting refill asap

## 2016-03-03 NOTE — Telephone Encounter (Signed)
Rx request sent to pharmacy.  

## 2017-02-02 ENCOUNTER — Ambulatory Visit: Payer: Self-pay | Admitting: Cardiology

## 2017-02-09 ENCOUNTER — Ambulatory Visit (INDEPENDENT_AMBULATORY_CARE_PROVIDER_SITE_OTHER): Payer: Self-pay | Admitting: Cardiology

## 2017-02-09 ENCOUNTER — Encounter: Payer: Self-pay | Admitting: Cardiology

## 2017-02-09 VITALS — BP 150/84 | HR 89 | Ht 60.0 in | Wt 259.0 lb

## 2017-02-09 DIAGNOSIS — I10 Essential (primary) hypertension: Secondary | ICD-10-CM

## 2017-02-09 DIAGNOSIS — D508 Other iron deficiency anemias: Secondary | ICD-10-CM

## 2017-02-09 DIAGNOSIS — R002 Palpitations: Secondary | ICD-10-CM

## 2017-02-09 DIAGNOSIS — R0989 Other specified symptoms and signs involving the circulatory and respiratory systems: Secondary | ICD-10-CM

## 2017-02-09 NOTE — Assessment & Plan Note (Signed)
Controlled.  

## 2017-02-09 NOTE — Assessment & Plan Note (Signed)
Chronic no change.  °

## 2017-02-09 NOTE — Patient Instructions (Signed)
Medication Instructions: No changes  Follow-Up: Your physician wants you to follow-up in: one year with Corine ShelterLuke Kilroy, PA. You will receive a reminder letter in the mail two months in advance. If you don't receive a letter, please call our office to schedule the follow-up appointment.   If you need a refill on your cardiac medications before your next appointment, please call your pharmacy.

## 2017-02-09 NOTE — Assessment & Plan Note (Signed)
History of carotid bruits in the past with negative dopplers 

## 2017-02-09 NOTE — Assessment & Plan Note (Signed)
BMI 50  

## 2017-02-09 NOTE — Assessment & Plan Note (Signed)
Followed by PCP- she is on Iron

## 2017-02-09 NOTE — Progress Notes (Signed)
02/09/2017 Shelia Clements   12/01/1965  [id number]  Primary Physician Pricilla HolmSharpe, Leslie M, MD Primary Cardiologist: Dr Allyson SabalBerry  HPI:  Shelia Circleracey Brown Hereford is a 51 y.o.obese, Caucasian female who we have seen in the past for hypertension and palpitations as well as dyslipidemia. She is currently be followed by Reather LittlerLeslie Sharp NP at [facility] in [place]. She has had problems with medication compliance in the past because of cost, but she says she has been able to afford her current medication regimen. 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 She makes too much for Medicaid and can't afford insurance on her own. Since we saw her last she has done well. She has occasional palpitations but no sustained tachycardia. She is now off caffeine and sodas.    Current Outpatient Prescriptions  Medication Sig Dispense Refill  . Docusate Calcium (STOOL SOFTENER PO) Take 1 capsule by mouth daily.    . hydrochlorothiazide (MICROZIDE) 12.5 MG capsule Take 1 capsule (12.5 mg total) by mouth as needed. 30 capsule 11  . iron polysaccharides (NIFEREX) 150 MG capsule Take 150 mg by mouth daily.    Marland Kitchen. lisinopril (PRINIVIL,ZESTRIL) 20 MG tablet Take 1 tablet (20 mg total) by mouth once daily. 30 tablet 11  . metoprolol tartrate (LOPRESSOR) 25 MG tablet Take 1 tablet (25 mg total) by mouth 2 (two) times daily. 60 tablet 11  . omeprazole (PRILOSEC) 20 MG capsule Take 1 capsule (20 mg total) by mouth daily. 30 capsule 11  . simvastatin (ZOCOR) 40 MG tablet Take 1 tablet (40 mg total) by mouth at bedtime. 30 tablet 11   No current facility-administered medications for this visit.     Allergies  Allergen Reactions  . Ferrous Sulfate   . Penicillins     Unknown reaction  . Sulfa Antibiotics Hives    Hives     Past Medical History:  Diagnosis Date  . Carotid bruit 10/2011   History of carotid bruits in the past with negative dopplers  . Degenerative joint disease   . Dyslipidemia   .  History of stress test 2006   Low risk study  . Hx of echocardiogram 2005   normal study  . Hypertension   . Iron deficiency anemia 2010   Never had endo/colon ([name])  . Noncompliance with medications    secondary to cost  . Obesity 2005   negative sleep study   . Palpitation     Social History   Social History  . Marital status: Married    Spouse name: N/A  . Number of children: N/A  . Years of education: N/A   Occupational History  . Not on file.   Social History Main Topics  . Smoking status: Never Smoker  . Smokeless tobacco: Never Used  . Alcohol use Not on file  . Drug use: Unknown  . Sexual activity: Not on file   Other Topics Concern  . Not on file   Social History Narrative  . No narrative on file     Family History  Problem Relation Age of Onset  . CAD Mother        CABG and stenting x2  . Aortic stenosis Father   . Stroke Sister   . Heart attack Sister   . Hypertension Mother   . Cancer Mother   . Diabetes Mother      Review of Systems: General: negative for chills, fever, night sweats or weight changes.  Cardiovascular:  negative for chest pain, dyspnea on exertion, edema, orthopnea, palpitations, paroxysmal nocturnal dyspnea or shortness of breath Dermatological: negative for rash Respiratory: negative for cough or wheezing Urologic: negative for hematuria Abdominal: negative for nausea, vomiting, diarrhea, bright red blood per rectum, melena, or hematemesis Neurologic: negative for visual changes, syncope, or dizziness All other systems reviewed and are otherwise negative except as noted above.    Blood pressure (!) 150/84, pulse 89, height 5' (1.524 m), weight 259 lb (117.5 kg).  General appearance: alert, cooperative, no distress and morbidly obese Neck: no JVD and soft Lt CA bruit Lungs: clear to auscultation bilaterally Heart: regular rate and rhythm and soft systolic murmur LSB Skin: Skin color, texture, turgor normal. No  rashes or lesions Neurologic: Grossly normal  EKG NSR  ASSESSMENT AND PLAN:   Hypertension Controlled  Iron deficiency anemia Followed by PCP- she is on Iron  Morbid obesity (HCC) BMI 50  Carotid bruit- normal dopplers 2013 History of carotid bruits in the past with negative dopplers  Palpitations Chronic- no change   PLAN  Same Rx. She has not been able to afford to see a hematologist about her anemia. I told her to contact us if her palpitations increase.  Corine Shelter PA-C 02/09/2017 9:55 AM

## 2017-02-20 ENCOUNTER — Other Ambulatory Visit: Payer: Self-pay | Admitting: Cardiology

## 2017-02-21 ENCOUNTER — Other Ambulatory Visit: Payer: Self-pay | Admitting: Cardiology

## 2017-02-23 ENCOUNTER — Telehealth: Payer: Self-pay | Admitting: Cardiovascular Disease

## 2017-02-23 MED ORDER — HYDROCHLOROTHIAZIDE 12.5 MG PO CAPS: 12.5000 mg | ORAL_CAPSULE | Freq: Every day | ORAL | 2 refills | Status: DC | PRN

## 2017-02-23 MED ORDER — SIMVASTATIN 40 MG PO TABS: 40.0000 mg | ORAL_TABLET | Freq: Every day | ORAL | 2 refills | Status: DC

## 2017-02-23 MED ORDER — POLYSACCHARIDE IRON COMPLEX 150 MG PO CAPS: 150.0000 mg | ORAL_CAPSULE | Freq: Every day | ORAL | 2 refills | Status: DC

## 2017-02-23 MED ORDER — OMEPRAZOLE 20 MG PO CPDR: DELAYED_RELEASE_CAPSULE | ORAL | 2 refills | Status: DC

## 2017-02-23 MED ORDER — LISINOPRIL 20 MG PO TABS: 20.0000 mg | ORAL_TABLET | Freq: Every day | ORAL | 2 refills | Status: DC

## 2017-02-23 MED ORDER — METOPROLOL TARTRATE 25 MG PO TABS: 25.0000 mg | ORAL_TABLET | Freq: Two times a day (BID) | ORAL | 2 refills | Status: DC

## 2017-02-23 NOTE — Telephone Encounter (Signed)
REFILL 

## 2017-02-23 NOTE — Telephone Encounter (Signed)
New message   Patient out of all medication    *STAT* If patient is at the pharmacy, call can be transferred to refill team.  1. Which medications need to be refilled? (please list name of each medication and dose if known)  Docusate Calcium (STOOL SOFTENER PO) Take 1 capsule by mouth daily.    hydrochlorothiazide (MICROZIDE) 12.5 MG capsule TAKE ONE CAPSULE BY MOUTH AS NEEDED   iron polysaccharides (NIFEREX) 150 MG capsule Take 150 mg by mouth daily.   lisinopril (PRINIVIL,ZESTRIL) 20 MG tablet TAKE ONE TABLET BY MOUTH ONCE DAILY   metoprolol tartrate (LOPRESSOR) 25 MG tablet TAKE ONE TABLET BY MOUTH TWICE DAILY   omeprazole (PRILOSEC) 20 MG capsule Take 1 capsule (20 mg total) by mouth daily.   simvastatin (ZOCOR) 40 MG tablet      2. Which pharmacy/location (including street and city if local pharmacy) is medication to be sent to? [facility] in [place]   3. Do they need a 30 day or 90 day supply? 30 days supply

## 2017-02-23 NOTE — Telephone Encounter (Signed)
Attempted to call to patient to inform her that rx have been sent to pharmacy; unable to leave voicemail.

## 2017-05-11 ENCOUNTER — Ambulatory Visit (INDEPENDENT_AMBULATORY_CARE_PROVIDER_SITE_OTHER): Payer: Self-pay

## 2017-05-11 ENCOUNTER — Ambulatory Visit (INDEPENDENT_AMBULATORY_CARE_PROVIDER_SITE_OTHER): Payer: Self-pay | Admitting: Podiatry

## 2017-05-11 ENCOUNTER — Encounter: Payer: Self-pay | Admitting: Podiatry

## 2017-05-11 VITALS — BP 131/62 | HR 87 | Resp 16

## 2017-05-11 DIAGNOSIS — M722 Plantar fascial fibromatosis: Secondary | ICD-10-CM

## 2017-05-11 DIAGNOSIS — I872 Venous insufficiency (chronic) (peripheral): Secondary | ICD-10-CM

## 2017-05-11 MED ORDER — METHYLPREDNISOLONE 4 MG PO TBPK: ORAL_TABLET | ORAL | 0 refills | Status: DC

## 2017-05-11 NOTE — Progress Notes (Signed)
Subjective:  Patient ID: Shelia Clements, female    DOB: [birth year],  MRN: [id number] HPI Chief Complaint  Patient presents with  . Foot Pain    Dorsal foot/medial and lateral ankle left - aching since June/July 2018, no injury, lots of swelling, worse at work through the day, no treatment, history of plantar fasciitis    51 y.o. female presents with the above complaint.     Past Medical History:  Diagnosis Date  . Carotid bruit 10/2011   History of carotid bruits in the past with negative dopplers  . Degenerative joint disease   . Dyslipidemia   . History of stress test 2006   Low risk study  . Hx of echocardiogram 2005   normal study  . Hypertension   . Iron deficiency anemia 2010   Never had endo/colon (Dr Loreta Ave)  . Noncompliance with medications    secondary to cost  . Obesity 2005   negative sleep study   . Palpitation    No past surgical history on file.  Current Outpatient Prescriptions:  .  Docusate Calcium (STOOL SOFTENER PO), Take 1 capsule by mouth daily., Disp: , Rfl:  .  hydrochlorothiazide (MICROZIDE) 12.5 MG capsule, Take 1 capsule (12.5 mg total) by mouth daily as needed., Disp: 90 capsule, Rfl: 2 .  iron polysaccharides (NIFEREX) 150 MG capsule, Take 1 capsule (150 mg total) by mouth daily., Disp: 90 capsule, Rfl: 2 .  lisinopril (PRINIVIL,ZESTRIL) 20 MG tablet, Take 1 tablet (20 mg total) by mouth daily., Disp: 90 tablet, Rfl: 2 .  metoprolol tartrate (LOPRESSOR) 25 MG tablet, Take 1 tablet (25 mg total) by mouth 2 (two) times daily., Disp: 180 tablet, Rfl: 2 .  omeprazole (PRILOSEC) 20 MG capsule, Take 1 capsule (20 mg total) by mouth daily., Disp: 90 capsule, Rfl: 2 .  simvastatin (ZOCOR) 40 MG tablet, Take 1 tablet (40 mg total) by mouth at bedtime., Disp: 90 tablet, Rfl: 2  Allergies  Allergen Reactions  . Ferrous Sulfate   . Penicillins     Unknown reaction  . Sulfa Antibiotics Hives    Hives    Review of Systems  HENT: Positive for  sinus pressure.   Respiratory: Positive for shortness of breath.   All other systems reviewed and are negative.  Objective:   Vitals:   05/11/17 0844  BP: 131/62  Pulse: 87  Resp: 16    General: Well developed, nourished, in no acute distress, alert and oriented x3   Dermatological: Skin is warm, dry and supple bilateral. Nails x 10 are well maintained; remaining integument appears unremarkable at this time. There are no open sores, no preulcerative lesions, no rash or signs of infection present.  Vascular: Dorsalis Pedis artery and Posterior Tibial artery pedal pulses are 2/4 bilateral with immedate capillary fill time. Pedal hair growth present. No varicosities and no lower extremity edema present bilateral. Multiple varicosities are noted in the leg. No calf pain.  Neruologic: Grossly intact via light touch bilateral. Vibratory intact via tuning fork bilateral. Protective threshold with Semmes Wienstein monofilament intact to all pedal sites bilateral. Patellar and Achilles deep tendon reflexes 2+ bilateral. No Babinski or clonus noted bilateral.   Musculoskeletal: No gross boney pedal deformities bilateral. No pain, crepitus, or limitation noted with foot and ankle range of motion bilateral. Muscular strength 5/5 in all groups tested bilateral.No pain on palpation of the foot or ankle. She does have soft tissue swelling and pitting edema.  Gait: Unassisted, Nonantalgic.  Radiographs:  Milligrams taken today do not demonstrate any type of osseus abnormalities other than the thickening of the plantar fascia and soft tissue increase in density at the Achilles insertion site. Plantar distally or any calcaneal spurs also noted.  Assessment & Plan:   Assessment: Venous insufficiency left greater than right.  Plan: Send her for Doppler evaluation venous stasis discussed compression garments.     Lacrecia Delval T. [place], [place]

## 2017-05-15 ENCOUNTER — Telehealth: Payer: Self-pay

## 2017-05-15 NOTE — Telephone Encounter (Signed)
Appt has been scheduled with [facility] for 06/18/17 @ 1pm.  Patient has been notified of appt.

## 2017-05-15 NOTE — Addendum Note (Signed)
Addended by: Geraldine Contras D on: 05/15/2017 10:04 AM   Modules accepted: Orders

## 2017-05-15 NOTE — Addendum Note (Signed)
Addended by: Geraldine Contras D on: 05/15/2017 10:13 AM   Modules accepted: Orders

## 2017-05-15 NOTE — Telephone Encounter (Signed)
-----   Message from Kristian Covey, [facility] sent at 05/11/2017  2:17 PM EDT ----- Regarding: Vascular Vascular - venous doppler and consultation - evaluate venous insufficiency left

## 2017-06-08 ENCOUNTER — Encounter (INDEPENDENT_AMBULATORY_CARE_PROVIDER_SITE_OTHER): Payer: Self-pay | Admitting: Vascular Surgery

## 2017-06-08 ENCOUNTER — Ambulatory Visit (INDEPENDENT_AMBULATORY_CARE_PROVIDER_SITE_OTHER): Payer: Self-pay

## 2017-06-08 DIAGNOSIS — I872 Venous insufficiency (chronic) (peripheral): Secondary | ICD-10-CM

## 2017-06-18 ENCOUNTER — Encounter (INDEPENDENT_AMBULATORY_CARE_PROVIDER_SITE_OTHER): Payer: Self-pay

## 2017-06-18 ENCOUNTER — Encounter (INDEPENDENT_AMBULATORY_CARE_PROVIDER_SITE_OTHER): Payer: Self-pay | Admitting: Vascular Surgery

## 2017-06-22 ENCOUNTER — Telehealth: Payer: Self-pay | Admitting: *Deleted

## 2017-06-22 ENCOUNTER — Telehealth: Payer: Self-pay

## 2017-06-22 ENCOUNTER — Encounter: Payer: Self-pay | Admitting: *Deleted

## 2017-06-22 NOTE — Telephone Encounter (Addendum)
[facility] carries the Compression hose Dr. Al CorpusHyatt recommends. Unable to leave message, home phone required remote access code. Unable to leave a message on work line, no one picked up on operator prompt. Mailed not informing pt that Dr. Al CorpusHyatt had recommended compression hose and [facility] contact information. Faxed order to [facility].

## 2017-06-22 NOTE — Telephone Encounter (Signed)
-----   Message from Elinor ParkinsonMax T Hyatt, [place] sent at 06/22/2017  6:50 AM EST ----- She will need thigh high compression garment bilateral but especially on the left.  Then follow up with me a month after that.

## 2017-06-22 NOTE — Telephone Encounter (Signed)
Spoke with patient today regarding venous studies.  Patient was informed Per Dr. Al CorpusHyatt, that she will need to purchase thigh high compression stockings which [facility], and see Vascular specialist for consult.  I informed her that I will call [facility] tomorrow and schedule appt and  Let her know when appt is.  She verbally understood instructions

## 2017-07-06 ENCOUNTER — Encounter (INDEPENDENT_AMBULATORY_CARE_PROVIDER_SITE_OTHER): Payer: Self-pay | Admitting: Vascular Surgery

## 2017-07-13 ENCOUNTER — Encounter (INDEPENDENT_AMBULATORY_CARE_PROVIDER_SITE_OTHER): Payer: Self-pay | Admitting: Vascular Surgery

## 2017-08-10 ENCOUNTER — Ambulatory Visit (INDEPENDENT_AMBULATORY_CARE_PROVIDER_SITE_OTHER): Payer: Self-pay | Admitting: Vascular Surgery

## 2017-08-10 ENCOUNTER — Encounter (INDEPENDENT_AMBULATORY_CARE_PROVIDER_SITE_OTHER): Payer: Self-pay | Admitting: Vascular Surgery

## 2017-08-10 VITALS — BP 147/65 | HR 88 | Resp 17 | Ht 63.0 in | Wt 260.0 lb

## 2017-08-10 DIAGNOSIS — E785 Hyperlipidemia, unspecified: Secondary | ICD-10-CM

## 2017-08-10 DIAGNOSIS — I890 Lymphedema, not elsewhere classified: Secondary | ICD-10-CM

## 2017-08-10 DIAGNOSIS — I872 Venous insufficiency (chronic) (peripheral): Secondary | ICD-10-CM

## 2017-08-10 DIAGNOSIS — I10 Essential (primary) hypertension: Secondary | ICD-10-CM

## 2017-08-10 NOTE — Progress Notes (Signed)
Subjective:    Patient ID: Shelia Clements, female    DOB: 04-10-1966, 52 y.o.   MRN: [id number] Chief Complaint  Patient presents with  . New Patient (Initial Visit)    ref Hyatt bil ankle swelling   The patient presents as a new patient referred by Dr. Al Corpus for evaluation of left lower extremity edema.  The patient endorses a long-standing history of swelling to the left lower extremity.  The patient notes the swelling progressively worsens throughout the day or with prolonged periods of standing.  This edema is associated with some discomfort.  At this time, the patient does not engage in conservative therapy including wearing medical grade 1 compression stockings or elevating her legs.  The patient describes her discomfort as a throbbing and tightness in the calf.  The patient's symptoms have progressed to the point she is unable to function on a daily basis.  Her symptoms are lifestyle limiting.  The patient underwent a left lower extremity venous reflux exam which is notable for venous incompetence in the great saphenous vein, anterior accessory saphenous and popliteal veins.  No evidence of deep or superficial vein thrombosis in the left lower extremity.  The patient denies any claudication-like symptoms, rest pain or ulceration to the lower extremity.  The patient denies any fever, nausea vomiting.   Review of Systems  Constitutional: Negative.   HENT: Negative.   Eyes: Negative.   Respiratory: Negative.   Cardiovascular: Positive for leg swelling.       Left lower extremity discomfort  Gastrointestinal: Negative.   Endocrine: Negative.   Genitourinary: Negative.   Musculoskeletal: Negative.   Skin: Negative.   Allergic/Immunologic: Negative.   Neurological: Negative.   Hematological: Negative.   Psychiatric/Behavioral: Negative.       Objective:   Physical Exam  Constitutional: She is oriented to person, place, and time. She appears well-developed and well-nourished.  No distress.  HENT:  Head: Normocephalic and atraumatic.  Eyes: Conjunctivae are normal. Pupils are equal, round, and reactive to light.  Neck: Normal range of motion.  Cardiovascular: Normal rate, regular rhythm, normal heart sounds and intact distal pulses.  Pulses:      Radial pulses are 2+ on the right side, and 2+ on the left side.       Dorsalis pedis pulses are 2+ on the right side, and 2+ on the left side.       Posterior tibial pulses are 2+ on the right side, and 2+ on the left side.  Pulmonary/Chest: Effort normal and breath sounds normal.  Musculoskeletal: Normal range of motion. She exhibits edema (Mild nonpitting edema noted bilaterally).  Neurological: She is alert and oriented to person, place, and time.  Skin: She is not diaphoretic.  Mixture of greater than and less than 1 cm varicosities noted to the bilateral lower extremity.  There are no skin changes, there is no stasis dermatitis or cellulitis bilaterally  Psychiatric: She has a normal mood and affect. Her behavior is normal. Judgment and thought content normal.  Vitals reviewed.  BP (!) 147/65 (BP Location: Right Arm)   Pulse 88   Resp 17   Ht 5\' 3"  (1.6 m)   Wt 260 lb (117.9 kg)   BMI 46.06 kg/m   Past Medical History:  Diagnosis Date  . Carotid bruit 10/2011   History of carotid bruits in the past with negative dopplers  . Degenerative joint disease   . Dyslipidemia   . History of stress test 2006  Low risk study  . Hx of echocardiogram 2005   normal study  . Hypertension   . Iron deficiency anemia 2010   Never had endo/colon (Dr Loreta Ave)  . Noncompliance with medications    secondary to cost  . Obesity 2005   negative sleep study   . Palpitation    Social History   Socioeconomic History  . Marital status: Married    Spouse name: Not on file  . Number of children: Not on file  . Years of education: Not on file  . Highest education level: Not on file  Social Needs  . Financial resource  strain: Not on file  . Food insecurity - worry: Not on file  . Food insecurity - inability: Not on file  . Transportation needs - medical: Not on file  . Transportation needs - non-medical: Not on file  Occupational History  . Not on file  Tobacco Use  . Smoking status: Never Smoker  . Smokeless tobacco: Never Used  Substance and Sexual Activity  . Alcohol use: No    Frequency: Never  . Drug use: No  . Sexual activity: Not on file  Other Topics Concern  . Not on file  Social History Narrative  . Not on file   Past Surgical History:  Procedure Laterality Date  . WISDOM TOOTH EXTRACTION     Family History  Problem Relation Age of Onset  . CAD Mother        CABG and stenting x2  . Hypertension Mother   . Cancer Mother   . Diabetes Mother   . Aortic stenosis Father   . Stroke Sister   . Heart attack Sister    Allergies  Allergen Reactions  . Ferrous Sulfate   . Penicillins     Unknown reaction  . Sulfa Antibiotics Hives    Hives       Assessment & Plan:  The patient presents as a new patient referred by Dr. Al Corpus for evaluation of left lower extremity edema.  The patient endorses a long-standing history of swelling to the left lower extremity.  The patient notes the swelling progressively worsens throughout the day or with prolonged periods of standing.  This edema is associated with some discomfort.  At this time, the patient does not engage in conservative therapy including wearing medical grade 1 compression stockings or elevating her legs.  The patient describes her discomfort as a throbbing and tightness in the calf.  The patient's symptoms have progressed to the point she is unable to function on a daily basis.  Her symptoms are lifestyle limiting.  The patient underwent a left lower extremity venous reflux exam which is notable for venous incompetence in the great saphenous vein, anterior accessory saphenous and popliteal veins.  No evidence of deep or superficial  vein thrombosis in the left lower extremity.  The patient denies any claudication-like symptoms, rest pain or ulceration to the lower extremity.  The patient denies any fever, nausea vomiting.  1. Chronic venous insufficiency - New The patient was encouraged to wear graduated compression stockings (20-30 mmHg) on a daily basis. The patient was instructed to begin wearing the stockings first thing in the morning and removing them in the evening. The patient was instructed specifically not to sleep in the stockings. Prescription given. In addition, behavioral modification including elevation during the day will be initiated. Anti-inflammatories for pain. The patient will follow up in three months to asses conservative management.  Information on chronic venous insufficiency  and compression stockings was given to the patient. The patient was instructed to call the office in the interim if any worsening edema or ulcerations to the legs, feet or toes occurs. The patient expresses their understanding.  2. Lymphedema - New The patient was encouraged to wear graduated compression stockings (20-30 mmHg) on a daily basis. The patient was instructed to begin wearing the stockings first thing in the morning and removing them in the evening. The patient was instructed specifically not to sleep in the stockings. Prescription In addition, behavioral modification including elevation during the day will be initiated. Anti-inflammatories for pain. Patient is to follow-up in 3 months to assess her progress with conservative therapy  3. Hyperlipidemia, unspecified hyperlipidemia type - Stable Encouraged good control as its slows the progression of atherosclerotic disease  4. Essential hypertension - Stable Encouraged good control as its slows the progression of atherosclerotic disease  Current Outpatient Medications on File Prior to Visit  Medication Sig Dispense Refill  . Docusate Calcium (STOOL SOFTENER PO)  Take 1 capsule by mouth daily.    . hydrochlorothiazide (MICROZIDE) 12.5 MG capsule Take 1 capsule (12.5 mg total) by mouth daily as needed. 90 capsule 2  . iron polysaccharides (NIFEREX) 150 MG capsule Take 1 capsule (150 mg total) by mouth daily. 90 capsule 2  . lisinopril (PRINIVIL,ZESTRIL) 20 MG tablet Take 1 tablet (20 mg total) by mouth daily. 90 tablet 2  . loratadine (CLARITIN) 10 MG tablet Take 10 mg by mouth daily.    . methylPREDNISolone (MEDROL DOSEPAK) 4 MG TBPK tablet 6 day dose pack - take as directed 21 tablet 0  . metoprolol tartrate (LOPRESSOR) 25 MG tablet Take 1 tablet (25 mg total) by mouth 2 (two) times daily. 180 tablet 2  . omeprazole (PRILOSEC) 20 MG capsule Take 1 capsule (20 mg total) by mouth daily. 90 capsule 2  . simvastatin (ZOCOR) 40 MG tablet Take 1 tablet (40 mg total) by mouth at bedtime. 90 tablet 2   No current facility-administered medications on file prior to visit.    There are no Patient Instructions on file for this visit. No Follow-up on file.  KIMBERLY A STEGMAYER, PA-C

## 2017-11-09 ENCOUNTER — Ambulatory Visit (INDEPENDENT_AMBULATORY_CARE_PROVIDER_SITE_OTHER): Payer: Self-pay | Admitting: Vascular Surgery

## 2017-11-19 ENCOUNTER — Encounter: Payer: Self-pay | Admitting: Cardiology

## 2017-11-19 ENCOUNTER — Ambulatory Visit (INDEPENDENT_AMBULATORY_CARE_PROVIDER_SITE_OTHER): Payer: Self-pay | Admitting: Cardiology

## 2017-11-19 VITALS — BP 128/82 | HR 81 | Ht 63.0 in | Wt 247.4 lb

## 2017-11-19 DIAGNOSIS — R002 Palpitations: Secondary | ICD-10-CM

## 2017-11-19 DIAGNOSIS — D649 Anemia, unspecified: Secondary | ICD-10-CM

## 2017-11-19 DIAGNOSIS — Z87898 Personal history of other specified conditions: Secondary | ICD-10-CM

## 2017-11-19 DIAGNOSIS — I10 Essential (primary) hypertension: Secondary | ICD-10-CM

## 2017-11-19 MED ORDER — METOPROLOL TARTRATE 25 MG PO TABS: ORAL_TABLET | ORAL | 9 refills | Status: DC

## 2017-11-19 NOTE — Assessment & Plan Note (Signed)
Followed by her PCP. She is taking iron now and says her Hgb is up to 8

## 2017-11-19 NOTE — Assessment & Plan Note (Signed)
BMI down to 43 from 48

## 2017-11-19 NOTE — Assessment & Plan Note (Signed)
I reassured her I thought she was just having PACs or PVCs. I suggested she could increase her Metoprolol to 37.5 mg in the morning for a week or two if needed. She knows to contact us if she has sustained tachycardia.

## 2017-11-19 NOTE — Progress Notes (Signed)
11/19/2017 Shelia Clements   February 18, 1966  [id number]  Primary Physician Pricilla HolmSharpe, Leslie M, MD Primary Cardiologist: Dr Allyson SabalBerry  HPI:  Pleasant [age] y/o female with a history of palpitations, HLD, anemia, and HTN. She had a low risk Myoview in 2006, and a normal echocardiogram in 2005 . She had a negative sleep study in 2005. She is in the office today with complaints of palpitations. She describes her heart "fliping". This has been present in the past but more so the last two weeks. She denies any sustained tachycardia. She does not use caffeine or stimulants. She saw her PCP recently who was concerned she had DM and this has caused her some stress. French Anaracy was prescribed Metformin but hasn't started it yet. She has managed to lose 13 lbs by changing her diet.    Current Outpatient Medications  Medication Sig Dispense Refill  . Docusate Calcium (STOOL SOFTENER PO) Take 1 capsule by mouth daily.    . hydrochlorothiazide (MICROZIDE) 12.5 MG capsule Take 1 capsule (12.5 mg total) by mouth daily as needed. 90 capsule 2  . iron polysaccharides (NIFEREX) 150 MG capsule Take 1 capsule (150 mg total) by mouth daily. 90 capsule 2  . lisinopril (PRINIVIL,ZESTRIL) 20 MG tablet Take 1 tablet (20 mg total) by mouth daily. 90 tablet 2  . loratadine (CLARITIN) 10 MG tablet Take 10 mg by mouth daily.    . metoprolol tartrate (LOPRESSOR) 25 MG tablet TAKE 37.5mg  (1.5 tab) in the morning and 25mg  in the evening 75 tablet 9  . omeprazole (PRILOSEC) 20 MG capsule Take 1 capsule (20 mg total) by mouth daily. 90 capsule 2  . simvastatin (ZOCOR) 40 MG tablet Take 1 tablet (40 mg total) by mouth at bedtime. 90 tablet 2  . Vitamin D, Ergocalciferol, (DRISDOL) 50000 units CAPS capsule Take 50,000 Units by mouth every 7 (seven) days.     No current facility-administered medications for this visit.     Allergies  Allergen Reactions  . Ferrous Sulfate   . Penicillins     Unknown reaction  . Sulfa Antibiotics  Hives    Hives     Past Medical History:  Diagnosis Date  . Carotid bruit 10/2011   History of carotid bruits in the past with negative dopplers  . Degenerative joint disease   . Dyslipidemia   . History of stress test 2006   Low risk study  . Hx of echocardiogram 2005   normal study  . Hypertension   . Iron deficiency anemia 2010   Never had endo/colon ([name])  . Noncompliance with medications    secondary to cost  . Obesity 2005   negative sleep study   . Palpitation     Social History   Socioeconomic History  . Marital status: Married    Spouse name: Not on file  . Number of children: Not on file  . Years of education: Not on file  . Highest education level: Not on file  Occupational History  . Not on file  Social Needs  . Financial resource strain: Not on file  . Food insecurity:    Worry: Not on file    Inability: Not on file  . Transportation needs:    Medical: Not on file    Non-medical: Not on file  Tobacco Use  . Smoking status: Never Smoker  . Smokeless tobacco: Never Used  Substance and Sexual Activity  . Alcohol use: No    Frequency: Never  . Drug  use: No  . Sexual activity: Not on file  Lifestyle  . Physical activity:    Days per week: Not on file    Minutes per session: Not on file  . Stress: Not on file  Relationships  . Social connections:    Talks on phone: Not on file    Gets together: Not on file    Attends religious service: Not on file    Active member of club or organization: Not on file    Attends meetings of clubs or organizations: Not on file    Relationship status: Not on file  . Intimate partner violence:    Fear of current or ex partner: Not on file    Emotionally abused: Not on file    Physically abused: Not on file    Forced sexual activity: Not on file  Other Topics Concern  . Not on file  Social History Narrative  . Not on file     Family History  Problem Relation Age of Onset  . CAD Mother        CABG and  stenting x2  . Hypertension Mother   . Cancer Mother   . Diabetes Mother   . Aortic stenosis Father   . Stroke Sister   . Heart attack Sister      Review of Systems: General: negative for chills, fever, night sweats or weight changes.  Cardiovascular: negative for chest pain, dyspnea on exertion, edema, orthopnea, palpitations, paroxysmal nocturnal dyspnea or shortness of breath Dermatological: negative for rash Respiratory: negative for cough or wheezing Urologic: negative for hematuria Abdominal: negative for nausea, vomiting, diarrhea, bright red blood per rectum, melena, or hematemesis Neurologic: negative for visual changes, syncope, or dizziness All other systems reviewed and are otherwise negative except as noted above.    Blood pressure 128/82, pulse 81, height 5\' 3"  (1.6 m), weight 247 lb 6.4 oz (112.2 kg).  General appearance: alert, cooperative, no distress and morbidly obese Neck: no carotid bruit and no JVD Lungs: clear to auscultation bilaterally Heart: regular rate and rhythm Extremities: extremities normal, atraumatic, no cyanosis or edema Skin: Skin color, texture, turgor normal. No rashes or lesions Neurologic: Grossly normal  EKG NSR  ASSESSMENT AND PLAN:   Palpitations I reassured her I thought she was just having PACs or PVCs. I suggested she could increase her Metoprolol to 37.5 mg in the morning for a week or two if needed. She knows to contact us if she has sustained tachycardia.   HTN (hypertension) Controlled  Morbid obesity (HCC) BMI down to 43 from 48  Chronic anemia Followed by her PCP. She is taking iron now and says her Hgb is up to 8   PLAN  Increase metoprolol as needed for palpitations. F/U one year. I congratulated her on her weight loss.   Corine Shelter PA-C 11/19/2017 12:14 PM

## 2017-11-19 NOTE — Patient Instructions (Signed)
Medication Instructions:  INCREASE Metoprolol to 37.5mg  in the morning and 25mg  in the evening  Labwork: None   Testing/Procedures: None   Follow-Up: Your physician wants you to follow-up in: 12 months with Dr Allyson SabalBerry ONLY. You will receive a reminder letter in the mail two months in advance. If you don't receive a letter, please call our office to schedule the follow-up appointment.  Any Other Special Instructions Will Be Listed Below (If Applicable). If you need a refill on your cardiac medications before your next appointment, please call your pharmacy.

## 2017-11-19 NOTE — Assessment & Plan Note (Signed)
Controlled.  

## 2017-12-04 ENCOUNTER — Ambulatory Visit: Payer: Self-pay | Admitting: Physician Assistant

## 2018-01-04 ENCOUNTER — Ambulatory Visit (INDEPENDENT_AMBULATORY_CARE_PROVIDER_SITE_OTHER): Payer: Self-pay | Admitting: Vascular Surgery

## 2018-02-26 ENCOUNTER — Other Ambulatory Visit: Payer: Self-pay | Admitting: Cardiology

## 2018-03-01 ENCOUNTER — Telehealth: Payer: Self-pay | Admitting: Cardiovascular Disease

## 2018-03-01 MED ORDER — SIMVASTATIN 40 MG PO TABS: 40.0000 mg | ORAL_TABLET | Freq: Every day | ORAL | 3 refills | Status: DC

## 2018-03-01 NOTE — Telephone Encounter (Signed)
°*  STAT* If patient is at the pharmacy, call can be transferred to refill team.   1. Which medications need to be refilled? (please list name of each medication and dose if known) Simvastatin 40mg   2. Which pharmacy/location (including street and city if local pharmacy) is medication to be sent to?[facility]   3. Do they need a 30 day or 90 day supply? 90day

## 2018-03-01 NOTE — Telephone Encounter (Signed)
Refill sent to the pharmacy electronically.  

## 2018-03-29 ENCOUNTER — Ambulatory Visit (INDEPENDENT_AMBULATORY_CARE_PROVIDER_SITE_OTHER): Payer: Self-pay | Admitting: Vascular Surgery

## 2018-05-14 ENCOUNTER — Other Ambulatory Visit: Payer: Self-pay | Admitting: Cardiology

## 2018-06-14 ENCOUNTER — Ambulatory Visit (INDEPENDENT_AMBULATORY_CARE_PROVIDER_SITE_OTHER): Payer: Self-pay | Admitting: Vascular Surgery

## 2018-08-16 ENCOUNTER — Ambulatory Visit (INDEPENDENT_AMBULATORY_CARE_PROVIDER_SITE_OTHER): Payer: Self-pay | Admitting: Vascular Surgery

## 2018-10-11 ENCOUNTER — Ambulatory Visit (INDEPENDENT_AMBULATORY_CARE_PROVIDER_SITE_OTHER): Payer: Self-pay | Admitting: Vascular Surgery

## 2018-11-12 ENCOUNTER — Telehealth: Payer: Self-pay

## 2018-11-12 NOTE — Telephone Encounter (Signed)
   Cardiac Questionnaire:    Since your last visit or hospitalization:    1. Have you been having new or worsening chest pain? NO   2. Have you been having new or worsening shortness of breath? NO 3. Have you been having new or worsening leg swelling, wt gain, or increase in abdominal girth (pants fitting more tightly)?OCCASSIONAL 4. Have you had any passing out spells? NO    *A YES to any of these questions would result in the appointment being kept. *If all the answers to these questions are NO, we should indicate that given the current situation regarding the worldwide coronarvirus pandemic, at the recommendation of the CDC, we are looking to limit gatherings in our waiting area, and thus will reschedule their appointment beyond four weeks from today.   _____________   COVID-19 Pre-Screening Questions:  . Do you currently have a fever? NO (yes = cancel and refer to pcp for e-visit) . Have you recently travelled on a cruise, internationally, or to [place], [place], [place], [place], [place], or [place], [place] [employer]) ? NO (yes = cancel, stay home, monitor symptoms, and contact pcp or initiate e-visit if symptoms develop) . Have you been in contact with someone that is currently pending confirmation of Covid19 testing or has been confirmed to have the Covid19 virus?  NO (yes = cancel, stay home, away from tested individual, monitor symptoms, and contact pcp or initiate e-visit if symptoms develop) . Are you currently experiencing fatigue or cough? NO (yes = pt should be prepared to have a mask placed at the time of their visit).

## 2018-11-12 NOTE — Telephone Encounter (Signed)
Called patient but no answer and no option to leave a voicemail. Not at work according to receptionist. Will try to contact patient again later.

## 2018-11-12 NOTE — Telephone Encounter (Signed)
Virtual Visit Pre-Appointment Phone Call  Steps For Call:  1. Confirm consent - "In the setting of the current Covid19 crisis, you are scheduled for a PHONE visit with your provider on 11/16/2018 at 3:30PM.  Just as we do with many in-office visits, in order for you to participate in this visit, we must obtain consent.  If you'd like, I can send this to your mychart (if signed up) or email for you to review.  Otherwise, I can obtain your verbal consent now.  All virtual visits are billed to your insurance company just like a normal visit would be.  By agreeing to a virtual visit, we'd like you to understand that the technology does not allow for your provider to perform an examination, and thus may limit your provider's ability to fully assess your condition.  Finally, though the technology is pretty good, we cannot assure that it will always work on either your or our end, and in the setting of a video visit, we may have to convert it to a phone-only visit.  In either situation, we cannot ensure that we have a secure connection.  Are you willing to proceed?"  2. Give patient instructions for WebEx download to smartphone as below if video visit  3. Advise patient to be prepared with any vital sign or heart rhythm information, their current medicines, and a piece of paper and pen handy for any instructions they may receive the day of their visit  4. Inform patient they will receive a phone call 15 minutes prior to their appointment time (may be from unknown caller ID) so they should be prepared to answer  5. Confirm that appointment type is correct in Epic appointment notes (video vs telephone)    TELEPHONE CALL NOTE  Shelia Clements has been deemed a candidate for a follow-up tele-health visit to limit community exposure during the Covid-19 pandemic. I spoke with the patient via phone to ensure availability of phone/video source, confirm preferred email & phone number, and discuss  instructions and expectations.  I reminded Shelia Clements to be prepared with any vital sign and/or heart rhythm information that could potentially be obtained via home monitoring, at the time of her visit. I reminded Shelia Clements to expect a phone call at the time of her visit if her visit.  Did the patient verbally acknowledge consent to treatment? YES  Shelia Clements, CMA 11/12/2018 4:26 PM   DOWNLOADING THE WEBEX SOFTWARE TO SMARTPHONE  - If Apple, go to [employer] and type in WebEx in the search bar. Download [employer], the blue/green circle. The app is free but as with any other app downloads, their phone may require them to verify saved payment information or Apple password. The patient does NOT have to create an account.  - If Android, ask patient to go to [employer] and type in WebEx in the search bar. Download [employer], the blue/green circle. The app is free but as with any other app downloads, their phone may require them to verify saved payment information or Android password. The patient does NOT have to create an account.   CONSENT FOR TELE-HEALTH VISIT - PLEASE REVIEW  I hereby voluntarily request, consent and authorize [facility] and its employed or contracted physicians, physician assistants, nurse practitioners or other licensed health care professionals (the Practitioner), to provide me with telemedicine health care services (the "Services") as deemed necessary by the treating Practitioner. I acknowledge  and consent to receive the Services by the Practitioner via telemedicine. I understand that the telemedicine visit will involve communicating with the Practitioner through live audiovisual communication technology and the disclosure of certain medical information by electronic transmission. I acknowledge that I have been given the opportunity to request an in-person assessment or other available alternative prior to the telemedicine  visit and am voluntarily participating in the telemedicine visit.  I understand that I have the right to withhold or withdraw my consent to the use of telemedicine in the course of my care at any time, without affecting my right to future care or treatment, and that the Practitioner or I may terminate the telemedicine visit at any time. I understand that I have the right to inspect all information obtained and/or recorded in the course of the telemedicine visit and may receive copies of available information for a reasonable fee.  I understand that some of the potential risks of receiving the Services via telemedicine include:  Marland Kitchen Delay or interruption in medical evaluation due to technological equipment failure or disruption; . Information transmitted may not be sufficient (e.g. poor resolution of images) to allow for appropriate medical decision making by the Practitioner; and/or  . In rare instances, security protocols could fail, causing a breach of personal health information.  Furthermore, I acknowledge that it is my responsibility to provide information about my medical history, conditions and care that is complete and accurate to the best of my ability. I acknowledge that Practitioner's advice, recommendations, and/or decision may be based on factors not within their control, such as incomplete or inaccurate data provided by me or distortions of diagnostic images or specimens that may result from electronic transmissions. I understand that the practice of medicine is not an exact science and that Practitioner makes no warranties or guarantees regarding treatment outcomes. I acknowledge that I will receive a copy of this consent concurrently upon execution via email to the email address I last provided but may also request a printed copy by calling the office of [facility].    I understand that my insurance will be billed for this visit.   I have read or had this consent read to me. . I  understand the contents of this consent, which adequately explains the benefits and risks of the Services being provided via telemedicine.  . I have been provided ample opportunity to ask questions regarding this consent and the Services and have had my questions answered to my satisfaction. . I give my informed consent for the services to be provided through the use of telemedicine in my medical care  By participating in this telemedicine visit I agree to the above.

## 2018-11-15 ENCOUNTER — Telehealth: Payer: Self-pay | Admitting: Cardiology

## 2018-11-15 NOTE — Telephone Encounter (Signed)
Unable to leave message to prereg. 11/15/18/dc

## 2018-11-16 ENCOUNTER — Telehealth (INDEPENDENT_AMBULATORY_CARE_PROVIDER_SITE_OTHER): Payer: Self-pay | Admitting: Cardiology

## 2018-11-16 ENCOUNTER — Telehealth: Payer: Self-pay

## 2018-11-16 ENCOUNTER — Encounter: Payer: Self-pay | Admitting: Cardiology

## 2018-11-16 ENCOUNTER — Ambulatory Visit: Payer: Self-pay | Admitting: Cardiology

## 2018-11-16 VITALS — Ht 63.0 in | Wt 190.0 lb

## 2018-11-16 DIAGNOSIS — Z6833 Body mass index (BMI) 33.0-33.9, adult: Secondary | ICD-10-CM

## 2018-11-16 DIAGNOSIS — E039 Hypothyroidism, unspecified: Secondary | ICD-10-CM

## 2018-11-16 DIAGNOSIS — E785 Hyperlipidemia, unspecified: Secondary | ICD-10-CM

## 2018-11-16 DIAGNOSIS — D649 Anemia, unspecified: Secondary | ICD-10-CM

## 2018-11-16 DIAGNOSIS — I10 Essential (primary) hypertension: Secondary | ICD-10-CM

## 2018-11-16 DIAGNOSIS — Z9289 Personal history of other medical treatment: Secondary | ICD-10-CM

## 2018-11-16 DIAGNOSIS — R002 Palpitations: Secondary | ICD-10-CM

## 2018-11-16 MED ORDER — METOPROLOL TARTRATE 25 MG PO TABS: 25.0000 mg | ORAL_TABLET | Freq: Two times a day (BID) | ORAL | 3 refills | Status: DC

## 2018-11-16 MED ORDER — LISINOPRIL 20 MG PO TABS: 20.0000 mg | ORAL_TABLET | Freq: Every day | ORAL | 3 refills | Status: DC

## 2018-11-16 MED ORDER — HYDROCHLOROTHIAZIDE 12.5 MG PO CAPS: 12.5000 mg | ORAL_CAPSULE | Freq: Every day | ORAL | 2 refills | Status: DC | PRN

## 2018-11-16 NOTE — Telephone Encounter (Signed)
Called patient to review AVS instructions and to make sure she did not have any questions. She voiced understanding.  AVS mailed to patient.

## 2018-11-16 NOTE — Patient Instructions (Addendum)
Medication Instructions:  Your physician recommends that you continue on your current medications as directed. Please refer to the Current Medication list given to you today. If you need a refill on your cardiac medications before your next appointment, please call your pharmacy.   Lab work: None  If you have labs (blood work) drawn today and your tests are completely normal, you will receive your results only by: Marland Kitchen MyChart Message (if you have MyChart) OR . A paper copy in the mail If you have any lab test that is abnormal or we need to change your treatment, we will call you to review the results.  Testing/Procedures: None   Follow-Up: At [facility], you and your health needs are our priority.  As part of our continuing mission to provide you with exceptional heart care, we have created designated Provider Care Teams.  These Care Teams include your primary Cardiologist (physician) and Advanced Practice Providers (APPs -  Physician Assistants and Nurse Practitioners) who all work together to provide you with the care you need, when you need it. You will need a follow up appointment in 12 months.  Please call our office 2 months in advance to schedule this appointment. You may see Corine Shelter, PA-C or one of the following Advanced Practice Providers on your designated Care Team:    Judy Pimple, [place] . Marjie Skiff, PA-C  Any Other Special Instructions Will Be Listed Below (If Applicable).

## 2018-11-16 NOTE — Progress Notes (Signed)
Virtual Visit via Telephone Note   This visit type was conducted due to national recommendations for restrictions regarding the COVID-19 Pandemic (e.g. social distancing) in an effort to limit this patient's exposure and mitigate transmission in our community.  Due to her co-morbid illnesses, this patient is at least at moderate risk for complications without adequate follow up.  This format is felt to be most appropriate for this patient at this time.  The patient did not have access to video technology/had technical difficulties with video requiring transitioning to audio format only (telephone).  All issues noted in this document were discussed and addressed.  No physical exam could be performed with this format.  Please refer to the patient's chart for her  consent [facility].  Evaluation Performed:  Follow-up visit  This visit type was conducted due to national recommendations for restrictions regarding the COVID-19 Pandemic (e.g. social distancing).  This format is felt to be most appropriate for this patient at this time.  All issues noted in this document were discussed and addressed.  No physical exam was performed (except for noted visual exam findings with Video Visits).  Please refer to the patient's chart (MyChart message for video visits and phone note for telephone visits) for the patient's consent to telehealth for [facility].  Date:  11/16/2018   ID:  Shelia Clements, DOB 25-Nov-1965, MRN [id number]  Patient Location[place] [address] [place] [id number]   Provider location:   [place]  PCP:  Pricilla HolmSharpe, Leslie M, MD  Cardiologist:  Dr Allyson SabalBerry Electrophysiologist:  None   Chief Complaint:  Yearly office visit  History of Present Illness:    Shelia Circleracey Brown Howells is a 53 y.o. female who presents via audio/video conferencing for a telehealth visit today.  Patient is a pleasant 53 y/o female with a history of palpitations, HLD,  anemia, and HTN. She had a low risk Myoview in 2006, and a normal echocardiogram in 2005. She had a negative sleep study in 2005. She was contacted today for her annual visit.  When I saw her last year she had had some increased palpitations.  We suggested she increase her beta-blocker for a week or so to see if that helped.  She is also recently been diagnosed with diabetes and was asked to start metformin.  After that visit she became quite concerned about the diagnosis of diabetes and managed to lose weight.  She is lost 60 pounds.  She says her hemoglobin A1c is now back to 5.2.  She is also been diagnosed with hypothyroidism and was put on Synthroid.  She still has occasional palpitations, essentially not changed.  They do not seem to bother her much and she is not had to take extra beta-blocker.  She was on HCTZ but is now just taking that as needed for edema.  She only takes it a couple times a week at most.  The patient does not symptoms concerning for COVID-19 infection (fever, chills, cough, or new SHORTNESS OF BREATH).    Prior CV studies:   The following studies were reviewed today:  Past Medical History:  Diagnosis Date  . Carotid bruit 10/2011   History of carotid bruits in the past with negative dopplers  . Degenerative joint disease   . Dyslipidemia   . History of stress test 2006   Low risk study  . Hx of echocardiogram 2005   normal study  . Hypertension   . Iron deficiency anemia  2010   Never had endo/colon (Dr Loreta Ave)  . Noncompliance with medications    secondary to cost  . Obesity 2005   negative sleep study   . Palpitation    Past Surgical History:  Procedure Laterality Date  . WISDOM TOOTH EXTRACTION       Current Meds  Medication Sig  . Biotin 1000 MCG tablet Take 1,000 mcg by mouth daily.  Tery Sanfilippo Calcium (STOOL SOFTENER PO) Take 1 capsule by mouth daily.  . hydrochlorothiazide (MICROZIDE) 12.5 MG capsule Take 1 capsule (12.5 mg total) by mouth daily as  needed.  . iron polysaccharides (NIFEREX) 150 MG capsule Take 1 capsule (150 mg total) by mouth daily.  Marland Kitchen levothyroxine (SYNTHROID, LEVOTHROID) 50 MCG tablet Take 50 mcg by mouth daily before breakfast.  . lisinopril (PRINIVIL,ZESTRIL) 20 MG tablet TAKE 1 TABLET BY MOUTH ONCE DAILY  . loratadine (CLARITIN) 10 MG tablet Take 10 mg by mouth daily.  . metoprolol tartrate (LOPRESSOR) 25 MG tablet Take 25 mg by mouth 2 (two) times daily.  Marland Kitchen omeprazole (PRILOSEC) 20 MG capsule Take 1 capsule (20 mg total) by mouth daily.  . simvastatin (ZOCOR) 40 MG tablet Take 1 tablet (40 mg total) by mouth at bedtime.  . Vitamin D, Ergocalciferol, (DRISDOL) 50000 units CAPS capsule Take 50,000 Units by mouth every 7 (seven) days.  . [DISCONTINUED] metoprolol tartrate (LOPRESSOR) 25 MG tablet TAKE 37.5mg  (1.5 tab) in the morning and 25mg  in the evening (Patient taking differently: 25 mg 2 (two) times daily. )     Allergies:   Ferrous sulfate; Penicillins; and Sulfa antibiotics   Social History   Tobacco Use  . Smoking status: Never Smoker  . Smokeless tobacco: Never Used  Substance Use Topics  . Alcohol use: No    Frequency: Never  . Drug use: No     Family Hx: The patient's family history includes Aortic stenosis in her father; CAD in her mother; Cancer in her mother; Diabetes in her mother; Heart attack in her sister; Hypertension in her mother; Stroke in her sister.  ROS:   Please see the history of present illness.    All other systems reviewed and are negative.   Labs/Other Tests and Data Reviewed:    Recent Labs: No results found for requested labs within last 8760 hours.   Recent Lipid Panel Lab Results  Component Value Date/Time   CHOL 150 06/06/2013 09:25 AM   TRIG 137 06/06/2013 09:25 AM   HDL 46 06/06/2013 09:25 AM   LDLCALC 77 06/06/2013 09:25 AM    Wt Readings from Last 3 Encounters:  11/16/18 190 lb (86.2 kg)  11/19/17 247 lb 6.4 oz (112.2 kg)  08/10/17 260 lb (117.9 kg)      Exam:    Vital Signs:  Ht 5\' 3"  (1.6 m)   Wt 190 lb (86.2 kg)   BMI 33.66 kg/m    ASSESSMENT & PLAN:    Palpitations Stable. She knows to contact us if she has sustained tachycardia.   HTN (hypertension) Controlled  Morbid obesity (HCC) BMI down to 33 from 43  Chronic anemia Followed by her PCP.  She tells me this has been stable.   Dyslipidemia continue statin Rx  Hypothyroid Synthroid added since LOV  COVID-19 Education: The signs and symptoms of COVID-19 were discussed with the patient and how to seek care for testing (follow up with PCP or arrange E-visit).  The importance of social distancing was discussed today.  Patient Risk:  After full review of this patients clinical status, I feel that they are at least moderate risk at this time.  Time:   Today, I have spent 20 minutes with the patient with telehealth technology discussing palpitation, diabetes, obesity.     Medication Adjustments/Labs and Tests Ordered: Current medicines are reviewed at length with the patient today.  Concerns regarding medicines are outlined above.  Tests Ordered: No orders of the defined types were placed in this encounter.  Medication Changes: Meds refilled  Disposition:  One year with me.   Jolene Provost, PA-C  11/16/2018 3:43 PM    [facility]

## 2019-01-10 ENCOUNTER — Ambulatory Visit (INDEPENDENT_AMBULATORY_CARE_PROVIDER_SITE_OTHER): Payer: Self-pay | Admitting: Vascular Surgery

## 2019-04-18 ENCOUNTER — Ambulatory Visit (INDEPENDENT_AMBULATORY_CARE_PROVIDER_SITE_OTHER): Payer: Self-pay | Admitting: Vascular Surgery

## 2019-08-15 ENCOUNTER — Ambulatory Visit (INDEPENDENT_AMBULATORY_CARE_PROVIDER_SITE_OTHER): Payer: Self-pay | Admitting: Vascular Surgery

## 2019-11-02 ENCOUNTER — Other Ambulatory Visit: Payer: Self-pay | Admitting: Cardiology

## 2019-11-03 ENCOUNTER — Telehealth: Payer: Self-pay | Admitting: Cardiology

## 2019-11-03 MED ORDER — METOPROLOL TARTRATE 25 MG PO TABS: 25.0000 mg | ORAL_TABLET | Freq: Two times a day (BID) | ORAL | 1 refills | Status: DC

## 2019-11-03 NOTE — Telephone Encounter (Signed)
New message      *STAT* If patient is at the pharmacy, call can be transferred to refill team.   1. Which medications need to be refilled? (please list name of each medication and dose if known)  Metoprolol 25mg   2. Which pharmacy/location (including street and city if local pharmacy) is medication to be sent to? [facility] [place]  3. Do they need a 30 day or 90 day supply? 90 day

## 2020-02-08 ENCOUNTER — Other Ambulatory Visit: Payer: Self-pay | Admitting: Cardiology

## 2020-02-09 ENCOUNTER — Other Ambulatory Visit: Payer: Self-pay | Admitting: Cardiovascular Disease

## 2020-02-09 MED ORDER — LISINOPRIL 20 MG PO TABS: 20.0000 mg | ORAL_TABLET | Freq: Every day | ORAL | 3 refills | Status: DC

## 2020-02-09 NOTE — Telephone Encounter (Signed)
Refill for Lisinopril sent to pharmacy. 

## 2020-02-09 NOTE — Telephone Encounter (Signed)
*  STAT* If patient is at the pharmacy, call can be transferred to refill team.   1. Which medications need to be refilled? (please list name of each medication and dose if known) lisinopril (PRINIVIL,ZESTRIL) 20 MG tablet  2. Which pharmacy/location (including street and city if local pharmacy) is medication to be sent to? [facility] - [place] [place] - [address]  3. Do they need a 30 day or 90 day supply? 90

## 2020-04-03 ENCOUNTER — Ambulatory Visit: Payer: Self-pay | Admitting: Cardiovascular Disease

## 2020-04-17 ENCOUNTER — Other Ambulatory Visit: Payer: Self-pay

## 2020-04-17 ENCOUNTER — Encounter: Payer: Self-pay | Admitting: Cardiovascular Disease

## 2020-04-17 ENCOUNTER — Ambulatory Visit (INDEPENDENT_AMBULATORY_CARE_PROVIDER_SITE_OTHER): Payer: 59 | Admitting: Cardiovascular Disease

## 2020-04-17 DIAGNOSIS — I10 Essential (primary) hypertension: Secondary | ICD-10-CM

## 2020-04-17 DIAGNOSIS — E785 Hyperlipidemia, unspecified: Secondary | ICD-10-CM | POA: Diagnosis not present

## 2020-04-17 DIAGNOSIS — R002 Palpitations: Secondary | ICD-10-CM | POA: Diagnosis not present

## 2020-04-17 NOTE — Assessment & Plan Note (Signed)
History of morbid obesity having weighed 260 pounds when I saw her 5 years ago and now 230.

## 2020-04-17 NOTE — Progress Notes (Signed)
[address]   [birth year]  [id number]  Primary Physician Shelia Pounds, PA Primary Cardiologist: Shelia Gess MD Shelia Clements, [place], [place]  HPI:  Shelia Clements is a 53 y.o.  morbidly overweight married Caucasian female with no children who I last saw in the office 02/13/2015. She has seen Shelia Clements [facility] several times since that since then. Her problems include a history of palpitations controlled on low dose beta blocker, hypertension, hyperlipidemia and family history of heart disease. She denies chest pain or shortness of breath. She had a negative Myoview many years ago.  Since I saw her 4 years ago she has seen Shelia Clements back in April 2020.  She had her thyroid medication changed by her endocrinologist which resulted in tachypalpitations however this was then discontinued and her palpitations have since resolved.  She is otherwise asymptomatic.   Current Meds  Medication Sig  . clobetasol ointment (TEMOVATE) 0.05 % SMARTSIG:Sparingly Topical Twice Daily PRN  . Docusate Calcium (STOOL SOFTENER PO) Take 1 capsule by mouth daily.  . hydrochlorothiazide (MICROZIDE) 12.5 MG capsule Take 1 capsule (12.5 mg total) by mouth daily as needed.  . iron polysaccharides (NIFEREX) 150 MG capsule Take 1 capsule (150 mg total) by mouth daily.  Marland Kitchen levothyroxine (SYNTHROID, LEVOTHROID) 50 MCG tablet Take 50 mcg by mouth daily before breakfast.  . lisinopril (ZESTRIL) 20 MG tablet Take 1 tablet (20 mg total) by mouth daily.  Marland Kitchen loratadine (CLARITIN) 10 MG tablet Take 10 mg by mouth daily.  . metoprolol tartrate (LOPRESSOR) 25 MG tablet Take 1 tablet by mouth twice daily  . omeprazole (PRILOSEC) 20 MG capsule Take 1 capsule (20 mg total) by mouth daily.  . simvastatin (ZOCOR) 40 MG tablet Take 1 tablet (40 mg total) by mouth at bedtime.     Allergies  Allergen Reactions  . Ferrous Sulfate   . Penicillins     Unknown reaction  . Sulfa Antibiotics Hives    Hives       Social History   Socioeconomic History  . Marital status: Married    Spouse name: Not on file  . Number of children: Not on file  . Years of education: Not on file  . Highest education level: Not on file  Occupational History  . Not on file  Tobacco Use  . Smoking status: Never Smoker  . Smokeless tobacco: Never Used  Substance and Sexual Activity  . Alcohol use: No  . Drug use: No  . Sexual activity: Not on file  Other Topics Concern  . Not on file  Social History Narrative  . Not on file   Social Determinants of Health   Financial Resource Strain:   . Difficulty of Paying Living Expenses: Not on file  Food Insecurity:   . Worried About Programme researcher, broadcasting/film/video in the Last Year: Not on file  . Ran Out of Food in the Last Year: Not on file  Transportation Needs:   . Lack of Transportation (Medical): Not on file  . Lack of Transportation (Non-Medical): Not on file  Physical Activity:   . Days of Exercise per Week: Not on file  . Minutes of Exercise per Session: Not on file  Stress:   . Feeling of Stress : Not on file  Social Connections:   . Frequency of Communication with Friends and Family: Not on file  . Frequency of Social Gatherings with Friends and Family: Not on file  . Attends Religious  Services: Not on file  . Active Member of Clubs or Organizations: Not on file  . Attends Banker Meetings: Not on file  . Marital Status: Not on file  Intimate Partner Violence:   . Fear of Current or Ex-Partner: Not on file  . Emotionally Abused: Not on file  . Physically Abused: Not on file  . Sexually Abused: Not on file     Review of Systems: General: negative for chills, fever, night sweats or weight changes.  Cardiovascular: negative for chest pain, dyspnea on exertion, edema, orthopnea, palpitations, paroxysmal nocturnal dyspnea or shortness of breath Dermatological: negative for rash Respiratory: negative for cough or wheezing Urologic: negative for  hematuria Abdominal: negative for nausea, vomiting, diarrhea, bright red blood per rectum, melena, or hematemesis Neurologic: negative for visual changes, syncope, or dizziness All other systems reviewed and are otherwise negative except as noted above.    Blood pressure (!) 177/91, pulse 68, height 5\' 2"  (1.575 m), weight 232 lb 9.6 oz (105.5 kg), SpO2 99 %.  General appearance: alert and no distress Neck: no adenopathy, no carotid bruit, no JVD, supple, symmetrical, trachea midline and thyroid not enlarged, symmetric, no tenderness/mass/nodules Lungs: clear to auscultation bilaterally Heart: regular rate and rhythm, S1, S2 normal, no murmur, click, rub or gallop Extremities: extremities normal, atraumatic, no cyanosis or edema Pulses: 2+ and symmetric Skin: Skin color, texture, turgor normal. No rashes or lesions Neurologic: Alert and oriented X 3, normal strength and tone. Normal symmetric reflexes. Normal coordination and gait  EKG sinus rhythm at 68 without ST or T wave changes.  I personally reviewed this EKG.  ASSESSMENT AND PLAN:   HTN (hypertension) History of essential hypertension blood pressure measured today 177/91.  She is on lisinopril and metoprolol which she has already taken today.  Palpitations History of palpitations in the past on low-dose beta-blocker which have essentially resolved.  Dyslipidemia History of hyperlipidemia on statin therapy followed by her PCP  Morbid obesity (HCC) History of morbid obesity having weighed 260 Clements when I saw her 5 years ago and now 230.      MD FACP,FACC,FAHA, [facility] 04/17/2020 11:14 AM

## 2020-04-17 NOTE — Assessment & Plan Note (Signed)
History of hyperlipidemia on statin therapy followed by her PCP. 

## 2020-04-17 NOTE — Patient Instructions (Signed)
Medication Instructions:  Your physician recommends that you continue on your current medications as directed. Please refer to the Current Medication list given to you today.  *If you need a refill on your cardiac medications before your next appointment, please call your pharmacy*   Follow-Up: At [facility], you and your health needs are our priority.  As part of our continuing mission to provide you with exceptional heart care, we have created designated Provider Care Teams.  These Care Teams include your primary Cardiologist (physician) and Advanced Practice Providers (APPs -  Physician Assistants and Nurse Practitioners) who all work together to provide you with the care you need, when you need it.  We recommend signing up for the patient portal called "MyChart".  Sign up information is provided on this After Visit Summary.  MyChart is used to connect with patients for Virtual Visits (Telemedicine).  Patients are able to view lab/test results, encounter notes, upcoming appointments, etc.  Non-urgent messages can be sent to your provider as well.   To learn more about what you can do with MyChart, go to [email or web address].    Your next appointment:   AS NEEDED with Dr. Berry 

## 2020-04-17 NOTE — Assessment & Plan Note (Signed)
History of palpitations in the past on low-dose beta-blocker which have essentially resolved.

## 2020-04-17 NOTE — Assessment & Plan Note (Signed)
History of essential hypertension blood pressure measured today 177/91.  She is on lisinopril and metoprolol which she has already taken today.

## 2020-05-02 ENCOUNTER — Telehealth: Payer: Self-pay | Admitting: Cardiovascular Disease

## 2020-05-02 NOTE — Telephone Encounter (Signed)
° ° °  Pt would like to speak with [facility], she said she spoke with her during her last OV 09/24 and was told to call her back about her BP

## 2020-05-02 NOTE — Telephone Encounter (Signed)
Returned call to patient of Dr. Allyson Sabal (seen 9/14 - advised to f/u PRN) Her BP was high at last visit She just got a home BP cuff last night BP was 167/90 last night  BP today was 176/91 taken same time as she took AM meds  Patient takes lisinopril QAM & metoprolol tartrate BID as prescribed She rarely uses PRN hctz  Asked that she check BP before meds, and check again 1-2 hours after taking meds - check twice daily Call in on Friday morning with readings and will send to Dr. [facility] pharmacy staff at that time

## 2020-05-04 ENCOUNTER — Telehealth: Payer: Self-pay | Admitting: Cardiovascular Disease

## 2020-05-04 NOTE — Telephone Encounter (Signed)
LMOM 1:30 pm.  BP elevated, will probably need increase in lisinopril dose, however last labs visible in system are from 2016 (none in [facility], only older in [facility]).    Need to determine PCP - Is [facility] correct? And call for more recent labs

## 2020-05-04 NOTE — Telephone Encounter (Signed)
Patient is returning call.  °

## 2020-05-04 NOTE — Telephone Encounter (Signed)
°  Pt c/o BP issue: STAT if pt c/o blurred vision, one-sided weakness or slurred speech  1. What are your last 5 BP readings?   9/28  167/90 9/29  176/91     Pm 153/90 9/30  160/93     Pm 168/93 10/1  154/84  2. Are you having any other symptoms (ex. Dizziness, headache, blurred vision, passed out)? Slight, lingering headache over the last week but has not had anything over the last three days  3. What is your BP issue? Patient was asked to call back with her bp readings  Patient is about to go to work but will available between 12-3 pm at [phone] Will be at home after 4pm

## 2020-05-04 NOTE — Telephone Encounter (Signed)
Update from phone note 9/29-routed to pharmD for recommendations.

## 2020-05-04 NOTE — Telephone Encounter (Signed)
Patient returning call.

## 2020-05-07 NOTE — Telephone Encounter (Signed)
Patient returned call, transferred to [facility].

## 2020-05-07 NOTE — Telephone Encounter (Signed)
Spoke with patient.  Home BP readings for past few days has dropped some, mostly 140's systolic.  Patient realized last week that she may have inadvertently caused the elevation by eating bacon almost daily for about 2-3 months.  She was trying to cut back on carbs and eat more protein because of elevated blood sugar readings.     She quit eating bacon about 4-5 days ago.  Advised that she should continue with home BP checks once or twice daily for another week.  If they continue to be < 160/90 she can call sooner.  In the meantime, I will reach out to her PCP for the most recent lab work.

## 2020-08-08 ENCOUNTER — Telehealth: Payer: Self-pay | Admitting: Cardiology

## 2020-08-08 NOTE — Telephone Encounter (Signed)
    *  STAT* If patient is at the pharmacy, call can be transferred to refill team.   1. Which medications need to be refilled? (please list name of each medication and dose if known) hydrochlorothiazide (MICROZIDE) 12.5 MG capsule  2. Which pharmacy/location (including street and city if local pharmacy) is medication to be sent to? [id number] - [place] ([address]), [place] - [address]  3. Do they need a 30 day or 90 day supply? 90 days

## 2020-08-14 MED ORDER — HYDROCHLOROTHIAZIDE 12.5 MG PO CAPS: 12.5000 mg | ORAL_CAPSULE | Freq: Every day | ORAL | 2 refills | Status: DC | PRN

## 2021-03-16 ENCOUNTER — Other Ambulatory Visit: Payer: Self-pay | Admitting: Cardiovascular Disease

## 2021-05-01 ENCOUNTER — Other Ambulatory Visit: Payer: Self-pay

## 2021-05-01 ENCOUNTER — Encounter: Payer: Self-pay | Admitting: Cardiovascular Disease

## 2021-05-01 ENCOUNTER — Ambulatory Visit (INDEPENDENT_AMBULATORY_CARE_PROVIDER_SITE_OTHER): Payer: 59 | Admitting: Cardiovascular Disease

## 2021-05-01 DIAGNOSIS — R002 Palpitations: Secondary | ICD-10-CM

## 2021-05-01 DIAGNOSIS — E785 Hyperlipidemia, unspecified: Secondary | ICD-10-CM

## 2021-05-01 DIAGNOSIS — I10 Essential (primary) hypertension: Secondary | ICD-10-CM

## 2021-05-01 MED ORDER — SIMVASTATIN 40 MG PO TABS: 40.0000 mg | ORAL_TABLET | Freq: Every day | ORAL | 3 refills | Status: DC

## 2021-05-01 MED ORDER — METOPROLOL TARTRATE 25 MG PO TABS
25.0000 mg | ORAL_TABLET | Freq: Two times a day (BID) | ORAL | 3 refills | Status: DC
Start: 2021-05-01 — End: 2021-06-20

## 2021-05-01 MED ORDER — LISINOPRIL 20 MG PO TABS
20.0000 mg | ORAL_TABLET | Freq: Every day | ORAL | 3 refills | Status: DC
Start: 2021-05-01 — End: 2022-03-17

## 2021-05-01 MED ORDER — POLYSACCHARIDE IRON COMPLEX 150 MG PO CAPS: 150.0000 mg | ORAL_CAPSULE | Freq: Every day | ORAL | 2 refills | Status: AC

## 2021-05-01 MED ORDER — HYDROCHLOROTHIAZIDE 12.5 MG PO CAPS: 12.5000 mg | ORAL_CAPSULE | Freq: Every day | ORAL | 3 refills | Status: DC | PRN

## 2021-05-01 NOTE — Patient Instructions (Signed)
Medication Instructions:  Your physician recommends that you continue on your current medications as directed. Please refer to the Current Medication list given to you today.  *If you need a refill on your cardiac medications before your next appointment, please call your pharmacy*   Follow-Up: At [facility], you and your health needs are our priority.  As part of our continuing mission to provide you with exceptional heart care, we have created designated Provider Care Teams.  These Care Teams include your primary Cardiologist (physician) and Advanced Practice Providers (APPs -  Physician Assistants and Nurse Practitioners) who all work together to provide you with the care you need, when you need it.  We recommend signing up for the patient portal called "MyChart".  Sign up information is provided on this After Visit Summary.  MyChart is used to connect with patients for Virtual Visits (Telemedicine).  Patients are able to view lab/test results, encounter notes, upcoming appointments, etc.  Non-urgent messages can be sent to your provider as well.   To learn more about what you can do with MyChart, go to [email or web address].    Your next appointment:   We will see you on an as needed basis. If you need to schedule an appointment please call our office.  Provider:   Jonathan Berry, MD 

## 2021-05-01 NOTE — Assessment & Plan Note (Signed)
History of palpitations in the past currently on beta-blocker.  This is no longer an issue.

## 2021-05-01 NOTE — Assessment & Plan Note (Signed)
History of essential hypertension a blood pressure measured today 130/80.  She is on lisinopril and metoprolol.

## 2021-05-01 NOTE — Assessment & Plan Note (Signed)
History of dyslipidemia on statin therapy followed by her PCP 

## 2021-05-01 NOTE — Progress Notes (Signed)
[address]   07-20-1966  [id number]  Primary Physician Gildardo Pounds, PA Primary Cardiologist: Runell Gess MD [name], [place], [place]  HPI:  Shelia Clements is a 55 y.o.  morbidly overweight married Caucasian female with no children who I last saw in the office 04/17/2020. She has seen [facility] several times since that since then. Her problems include a history of palpitations controlled on low dose beta blocker, hypertension, hyperlipidemia and family history of heart disease. She denies chest pain or shortness of breath. She had a negative Myoview many years ago.   Since I saw her a year ago she continues to do well.  She no longer has palpitations, chest pain or shortness of breath.   Current Meds  Medication Sig   clobetasol ointment (TEMOVATE) 0.05 % SMARTSIG:Sparingly Topical Twice Daily PRN   diclofenac Sodium (VOLTAREN) 1 % GEL SMARTSIG:2 Topical 1-4 Times Daily   Docusate Calcium (STOOL SOFTENER PO) Take 1 capsule by mouth daily.   hydrochlorothiazide (MICROZIDE) 12.5 MG capsule TAKE 1 CAPSULE BY MOUTH ONCE DAILY AS NEEDED   iron polysaccharides (NIFEREX) 150 MG capsule Take 1 capsule (150 mg total) by mouth daily.   levothyroxine (SYNTHROID, LEVOTHROID) 50 MCG tablet Take 50 mcg by mouth daily before breakfast.   lisinopril (ZESTRIL) 20 MG tablet Take 1 tablet (20 mg total) by mouth daily.   loratadine (CLARITIN) 10 MG tablet Take 10 mg by mouth daily.   metoprolol tartrate (LOPRESSOR) 25 MG tablet Take 1 tablet by mouth twice daily   omeprazole (PRILOSEC) 20 MG capsule Take 1 capsule (20 mg total) by mouth daily.   simvastatin (ZOCOR) 40 MG tablet Take 1 tablet (40 mg total) by mouth at bedtime.     Allergies  Allergen Reactions   Ferrous Sulfate    Penicillins     Unknown reaction   Sulfa Antibiotics Hives    Hives     Social History   Socioeconomic History   Marital status: Married    Spouse name: Not on file    Number of children: Not on file   Years of education: Not on file   Highest education level: Not on file  Occupational History   Not on file  Tobacco Use   Smoking status: Never   Smokeless tobacco: Never  Substance and Sexual Activity   Alcohol use: No   Drug use: No   Sexual activity: Not on file  Other Topics Concern   Not on file  Social History Narrative   Not on file   Social Determinants of Health   Financial Resource Strain: Not on file  Food Insecurity: Not on file  Transportation Needs: Not on file  Physical Activity: Not on file  Stress: Not on file  Social Connections: Not on file  Intimate Partner Violence: Not on file     Review of Systems: General: negative for chills, fever, night sweats or weight changes.  Cardiovascular: negative for chest pain, dyspnea on exertion, edema, orthopnea, palpitations, paroxysmal nocturnal dyspnea or shortness of breath Dermatological: negative for rash Respiratory: negative for cough or wheezing Urologic: negative for hematuria Abdominal: negative for nausea, vomiting, diarrhea, bright red blood per rectum, melena, or hematemesis Neurologic: negative for visual changes, syncope, or dizziness All other systems reviewed and are otherwise negative except as noted above.    Blood pressure 130/80, pulse 67, height 5\' 2"  (1.575 m), weight 239 lb 9.6 oz (108.7 kg), SpO2 98 %.  General appearance: alert and no distress Neck: no adenopathy, no carotid bruit, no JVD, supple, symmetrical, trachea midline, and thyroid not enlarged, symmetric, no tenderness/mass/nodules Lungs: clear to auscultation bilaterally Heart: regular rate and rhythm, S1, S2 normal, no murmur, click, rub or gallop Extremities: extremities normal, atraumatic, no cyanosis or edema Pulses: 2+ and symmetric Skin: Skin color, texture, turgor normal. No rashes or lesions Neurologic: Grossly normal  EKG sinus rhythm at 67 with poor R wave progression.  I personally  reviewed this EKG.  ASSESSMENT AND PLAN:   HTN (hypertension) History of essential hypertension a blood pressure measured today 130/80.  She is on lisinopril and metoprolol.  Palpitations History of palpitations in the past currently on beta-blocker.  This is no longer an issue.  Dyslipidemia History of dyslipidemia on statin therapy followed by her PCP     Runell Gess MD [facility], [facility] 05/01/2021 12:07 PM

## 2021-06-19 ENCOUNTER — Telehealth: Payer: Self-pay | Admitting: Cardiovascular Disease

## 2021-06-19 NOTE — Telephone Encounter (Signed)
Called in wanting to speak with a nurse. Please advise

## 2021-06-19 NOTE — Telephone Encounter (Signed)
Returned patient's call. Patient concerned about having increased amount of palpitations of the last week and a half. She wants to know if she needs a change in her metoprolol tartrate of 25 mg twice daily, or a change in her other medications, as well.

## 2021-06-20 MED ORDER — METOPROLOL TARTRATE 25 MG PO TABS
37.5000 mg | ORAL_TABLET | Freq: Two times a day (BID) | ORAL | 3 refills | Status: DC
Start: 2021-06-20 — End: 2022-03-17

## 2021-06-20 NOTE — Addendum Note (Signed)
Addended by: Tobin Chad on: 06/20/2021 10:40 AM   Modules accepted: Orders

## 2021-06-20 NOTE — Telephone Encounter (Signed)
Per Dr Allyson Sabal   Can increase metoprolol to 1-1/2 tablets p.o. twice daily Runell Gess, MD    Call  no answer unable to leave a message

## 2021-06-20 NOTE — Telephone Encounter (Signed)
Spoke to patient . She is aware of the increase  of metoprolol to 37.5 mg  twice a day form 25 mg twice a day . She wanted to know if  change was permanent or not. RN informed patient this was a permanent change. She verbalized understanding.   Prescription was e-sent to pharmacy with increase quantity and directions

## 2021-06-20 NOTE — Telephone Encounter (Signed)
Pt is returning call from yesterday [phone]

## 2021-09-11 ENCOUNTER — Emergency Department
Admission: EM | Admit: 2021-09-11 | Discharge: 2021-09-11 | Disposition: A | Discharge status: Home / Self Care | Payer: Self-pay | Attending: Emergency Medicine | Admitting: Emergency Medicine

## 2021-09-11 ENCOUNTER — Encounter: Payer: Self-pay | Admitting: Emergency Medicine

## 2021-09-11 DIAGNOSIS — N760 Acute vaginitis: Secondary | ICD-10-CM | POA: Insufficient documentation

## 2021-09-11 LAB — CBC WITH DIFFERENTIAL/PLATELET
Abs Immature Granulocytes: 0.02 10*3/uL (ref 0.00–0.07)
Basophils Absolute: 0 10*3/uL (ref 0.0–0.1)
Basophils Relative: 1 %
Eosinophils Absolute: 0.1 10*3/uL (ref 0.0–0.5)
Eosinophils Relative: 1 %
HCT: 42.5 % (ref 36.0–46.0)
Hemoglobin: 12.8 g/dL (ref 12.0–15.0)
Immature Granulocytes: 0 %
Lymphocytes Relative: 12 %
Lymphs Abs: 1 10*3/uL (ref 0.7–4.0)
MCH: 24.1 pg — ABNORMAL LOW (ref 26.0–34.0)
MCHC: 30.1 g/dL (ref 30.0–36.0)
MCV: 79.9 fL — ABNORMAL LOW (ref 80.0–100.0)
Monocytes Absolute: 0.5 10*3/uL (ref 0.1–1.0)
Monocytes Relative: 6 %
Neutro Abs: 6.6 10*3/uL (ref 1.7–7.7)
Neutrophils Relative %: 80 %
Platelets: 275 10*3/uL (ref 150–400)
RBC: 5.32 MIL/uL — ABNORMAL HIGH (ref 3.87–5.11)
RDW: 15.5 % (ref 11.5–15.5)
WBC: 8.2 10*3/uL (ref 4.0–10.5)
nRBC: 0 % (ref 0.0–0.2)

## 2021-09-11 LAB — COMPREHENSIVE METABOLIC PANEL
ALT: 23 U/L (ref 0–44)
AST: 24 U/L (ref 15–41)
Albumin: 3.8 g/dL (ref 3.5–5.0)
Alkaline Phosphatase: 81 U/L (ref 38–126)
Anion gap: 7 (ref 5–15)
BUN: 20 mg/dL (ref 6–20)
CO2: 25 mmol/L (ref 22–32)
Calcium: 8.9 mg/dL (ref 8.9–10.3)
Chloride: 107 mmol/L (ref 98–111)
Creatinine, Ser: 0.72 mg/dL (ref 0.44–1.00)
GFR, Estimated: >60 mL/min (ref >60)
Glucose, Bld: 119 mg/dL — ABNORMAL HIGH (ref 70–99)
Potassium: 3.7 mmol/L (ref 3.5–5.1)
Sodium: 139 mmol/L (ref 135–145)
Total Bilirubin: 0.6 mg/dL (ref 0.3–1.2)
Total Protein: 7.3 g/dL (ref 6.5–8.1)

## 2021-09-11 MED ORDER — CLOTRIMAZOLE 1 % EX OINT: 1.0000 "application " | TOPICAL_OINTMENT | Freq: Two times a day (BID) | CUTANEOUS | 0 refills | Status: AC

## 2021-09-11 NOTE — ED Triage Notes (Signed)
Vaginal bleeding today.

## 2021-09-11 NOTE — Discharge Instructions (Addendum)
Use the topical anti-fungal cream as directed. Follow-up with your primary provider as needed.

## 2021-09-11 NOTE — ED Provider Notes (Signed)
[facility] Emergency Department Provider Note     Event Date/Time   First MD Initiated Contact with Patient 09/11/21 1425     (approximate)   History   Vaginal Bleeding   HPI  Shelia Clements is a 56 y.o. female presents is up to the ED for evaluation of vaginal bleeding.  Patient is under the impression that she is having bleeding from the introitus versus bleeding.  Onset occurred after she vigorously scratched the vulvar area secondary to some skin irritation.  Denies any dysuria, hematuria, or vaginal discharge.   Physical Exam   Triage Vital Signs: ED Triage Vitals  Enc Vitals Group     BP 09/11/21 1332 (!) 198/98     Pulse Rate 09/11/21 1332 85     Resp 09/11/21 1332 18     Temp 09/11/21 1332 99.1 F (37.3 C)     Temp Source 09/11/21 1332 Oral     SpO2 09/11/21 1332 96 %     Weight 09/11/21 1309 239 lb 10.2 oz (108.7 kg)     Height 09/11/21 1309 5\' 2"  (1.575 m)     Head Circumference --      Peak Flow --      Pain Score 09/11/21 1309 0     Pain Loc --      Pain Edu? --      Excl. in [place]? --     Most recent vital signs: Vitals:   09/11/21 1332  BP: (!) 198/98  Pulse: 85  Resp: 18  Temp: 99.1 F (37.3 C)  SpO2: 96%    General Awake, no distress.  CV:  Good peripheral perfusion.  RESP:  Normal effort.  ABD:  No distention.  GU:   Normal external genitalia, except for some local skin irritation and redness to the labia externa.  There is also a superficial fissure noted to the left labia.   ED Results / Procedures / Treatments   Labs (all labs ordered are listed, but only abnormal results are displayed) Labs Reviewed  CBC WITH DIFFERENTIAL/PLATELET - Abnormal; Notable for the following components:      Result Value   RBC 5.32 (*)    MCV 79.9 (*)    MCH 24.1 (*)    All other components within normal limits  COMPREHENSIVE METABOLIC PANEL - Abnormal; Notable for the following components:   Glucose, Bld 119 (*)     All other components within normal limits  POC URINE PREG, ED     EKG  RADIOLOGY  No results found.   PROCEDURES:  Critical Care performed: No  Procedures   MEDICATIONS ORDERED IN ED: Medications - No data to display   IMPRESSION / MDM / [place] / ED COURSE  I reviewed the triage vital signs and the nursing notes.                              Differential diagnosis includes, but is not limited to, vulvar vaginitis, UTI, vulvar fissure  Patient ED evaluation of acute superficial bleeding to the vulva after she fickle her scratched area.  She presents to the ED for evaluation in no acute distress.  Patient overall has a reassuring exam, evaluation does reveal some local skin irritation and likely area of irritation and bleeding.  Actively on exam.  Labs are reassuring as it shows no acute anemia, and no leukocytosis.  Patient's diagnosis is consistent  with vulvar vaginitis, likely due to yeast. Patient will be discharged home with prescriptions for clotrimazole ointment. Patient is to follow up with her PCP as needed or otherwise directed. Patient is given ED precautions to return to the ED for any worsening or new symptoms.    FINAL CLINICAL IMPRESSION(S) / ED DIAGNOSES   Final diagnoses:  Acute vaginitis     Rx / DC Orders   ED Discharge Orders          Ordered    Clotrimazole 1 % OINT  2 times daily        09/11/21 1538             Note:  This document was prepared using Dragon voice recognition software and may include unintentional dictation errors.    Melvenia Needles, PA-C 09/11/21 1559    Merlyn Lot, MD 09/11/21 [phone]

## 2021-09-11 NOTE — ED Notes (Signed)
Pt reports bleeding in her vaginal area, unsure if it is actually vaginal bleeding. Pt states she thinks that she may have torn the skin when she was taking a bath this AM.

## 2022-03-17 ENCOUNTER — Encounter: Payer: Self-pay | Admitting: Physician Assistant

## 2022-03-17 ENCOUNTER — Ambulatory Visit (INDEPENDENT_AMBULATORY_CARE_PROVIDER_SITE_OTHER): Payer: Self-pay | Admitting: Physician Assistant

## 2022-03-17 VITALS — BP 160/78 | HR 68 | Ht 62.0 in | Wt 236.2 lb

## 2022-03-17 DIAGNOSIS — R0789 Other chest pain: Secondary | ICD-10-CM

## 2022-03-17 DIAGNOSIS — Z8249 Family history of ischemic heart disease and other diseases of the circulatory system: Secondary | ICD-10-CM

## 2022-03-17 DIAGNOSIS — E785 Hyperlipidemia, unspecified: Secondary | ICD-10-CM

## 2022-03-17 DIAGNOSIS — I10 Essential (primary) hypertension: Secondary | ICD-10-CM

## 2022-03-17 MED ORDER — LISINOPRIL 40 MG PO TABS: 40.0000 mg | ORAL_TABLET | Freq: Every day | ORAL | 3 refills | Status: DC

## 2022-03-17 NOTE — Patient Instructions (Addendum)
Medication Instructions:  INCREASE Lisinopril to 40 mg daily  *If you need a refill on your cardiac medications before your next appointment, please call your pharmacy*  Lab Work: NONE ordered at this time of appointment   If you have labs (blood work) drawn today and your tests are completely normal, you will receive your results only by: MyChart Message (if you have MyChart) OR A paper copy in the mail If you have any lab test that is abnormal or we need to change your treatment, we will call you to review the results.  Testing/Procedures: Your physician has requested that you have an echocardiogram. Echocardiography is a painless test that uses sound waves to create images of your heart. It provides your doctor with information about the size and shape of your heart and how well your heart's chambers and valves are working. This procedure takes approximately one hour. There are no restrictions for this procedure.   Azalee Course, PA-C has ordered a CT coronary calcium score.   Test locations:  [facility]   This is $99 out of pocket.   Coronary CalciumScan A coronary calcium scan is an imaging test used to look for deposits of calcium and other fatty materials (plaques) in the inner lining of the blood vessels of the heart (coronary arteries). These deposits of calcium and plaques can partly clog and narrow the coronary arteries without producing any symptoms or warning signs. This puts a person at risk for a heart attack. This test can detect these deposits before symptoms develop. Tell a health care provider about: Any allergies you have. All medicines you are taking, including vitamins, herbs, eye drops, creams, and over-the-counter medicines. Any problems you or family members have had with anesthetic medicines. Any blood disorders you have. Any surgeries you have had. Any medical conditions you have. Whether you are pregnant or may be  pregnant. What are the risks? Generally, this is a safe procedure. However, problems may occur, including: Harm to a pregnant woman and her unborn baby. This test involves the use of radiation. Radiation exposure can be dangerous to a pregnant woman and her unborn baby. If you are pregnant, you generally should not have this procedure done. Slight increase in the risk of cancer. This is because of the radiation involved in the test. What happens before the procedure? No preparation is needed for this procedure. What happens during the procedure? You will undress and remove any jewelry around your neck or chest. You will put on a hospital gown. Sticky electrodes will be placed on your chest. The electrodes will be connected to an electrocardiogram (ECG) machine to record a tracing of the electrical activity of your heart. A CT scanner will take pictures of your heart. During this time, you will be asked to lie still and hold your breath for 2-3 seconds while a picture of your heart is being taken. The procedure may vary among health care providers and hospitals. What happens after the procedure? You can get dressed. You can return to your normal activities. It is up to you to get the results of your test. Ask your health care provider, or the department that is doing the test, when your results will be ready. Summary A coronary calcium scan is an imaging test used to look for deposits of calcium and other fatty materials (plaques) in the inner lining of the blood vessels of the heart (coronary arteries). Generally, this is a safe procedure. Tell your health care  provider if you are pregnant or may be pregnant. No preparation is needed for this procedure. A CT scanner will take pictures of your heart. You can return to your normal activities after the scan is done. This information is not intended to replace advice given to you by your health care provider. Make sure you discuss any questions  you have with your health care provider. Document Released: 01/17/2008 Document Revised: 06/09/2016 Document Reviewed: 06/09/2016 Elsevier Interactive Patient Education  2017 [employer].  Follow-Up: At [facility], you and your health needs are our priority.  As part of our continuing mission to provide you with exceptional heart care, we have created designated Provider Care Teams.  These Care Teams include your primary Cardiologist (physician) and Advanced Practice Providers (APPs -  Physician Assistants and Nurse Practitioners) who all work together to provide you with the care you need, when you need it.  We recommend signing up for the patient portal called "MyChart".  Sign up information is provided on this After Visit Summary.  MyChart is used to connect with patients for Virtual Visits (Telemedicine).  Patients are able to view lab/test results, encounter notes, upcoming appointments, etc.  Non-urgent messages can be sent to your provider as well.   To learn more about what you can do with MyChart, go to [email or web address].    Your next appointment:   4-6 week(s)  The format for your next appointment:   In Person  Provider:   APP         Other Instructions  Heart-Healthy Eating Plan Eating a healthy diet is important for the health of your heart. A heart-healthy eating plan includes: Eating less unhealthy fats. Eating more healthy fats. Eating less salt in your food. Salt is also called sodium. Making other changes in your diet. Talk with your doctor or a diet specialist (dietitian) to create an eating plan that is right for you. What is my plan? Your doctor may recommend an eating plan that includes: Total fat: ______% or less of total calories a day. Saturated fat: ______% or less of total calories a day. Cholesterol: less than _________mg a day. Sodium: less than _________mg a day. What are tips for following this plan? Cooking Avoid frying your food. Try to  bake, boil, grill, or broil it instead. You can also reduce fat by: Removing the skin from poultry. Removing all visible fats from meats. Steaming vegetables in water or broth. Meal planning  At meals, divide your plate into four equal parts: Fill one-half of your plate with vegetables and green salads. Fill one-fourth of your plate with whole grains. Fill one-fourth of your plate with lean protein foods. Eat 2-4 cups of vegetables per day. One cup of vegetables is: 1 cup (91 g) broccoli or cauliflower florets. 2 medium carrots. 1 large bell pepper. 1 large sweet potato. 1 large tomato. 1 medium white potato. 2 cups (150 g) raw leafy greens. Eat 1-2 cups of fruit per day. One cup of fruit is: 1 small apple 1 large banana 1 cup (237 g) mixed fruit, 1 large orange,  cup (82 g) dried fruit, 1 cup (240 mL) 100% fruit juice. Eat more foods that have soluble fiber. These are apples, broccoli, carrots, beans, peas, and barley. Try to get 20-30 g of fiber per day. Eat 4-5 servings of nuts, legumes, and seeds per week: 1 serving of dried beans or legumes equals  cup (90 g) cooked. 1 serving of nuts is  oz (12 almonds,  24 pistachios, or 7 walnut halves). 1 serving of seeds equals  oz (8 g). General information Eat more home-cooked food. Eat less restaurant, buffet, and fast food. Limit or avoid alcohol. Limit foods that are high in starch and sugar. Avoid fried foods. Lose weight if you are overweight. Keep track of how much salt (sodium) you eat. This is important if you have high blood pressure. Ask your doctor to tell you more about this. Try to add vegetarian meals each week. Fats Choose healthy fats. These include olive oil and canola oil, flaxseeds, walnuts, almonds, and seeds. Eat more omega-3 fats. These include salmon, mackerel, sardines, tuna, flaxseed oil, and ground flaxseeds. Try to eat fish at least 2 times each week. Check food labels. Avoid foods with trans fats  or high amounts of saturated fat. Limit saturated fats. These are often found in animal products, such as meats, butter, and cream. These are also found in plant foods, such as palm oil, palm kernel oil, and coconut oil. Avoid foods with partially hydrogenated oils in them. These have trans fats. Examples are stick margarine, some tub margarines, cookies, crackers, and other baked goods. What foods should I eat? Fruits All fresh, canned (in natural juice), or frozen fruits. Vegetables Fresh or frozen vegetables (raw, steamed, roasted, or grilled). Green salads. Grains Most grains. Choose whole wheat and whole grains most of the time. Rice and pasta, including brown rice and pastas made with whole wheat. Meats and other proteins Lean, well-trimmed beef, veal, pork, and lamb. Chicken and [place] without skin. All fish and shellfish. Wild duck, rabbit, pheasant, and venison. Egg whites or low-cholesterol egg substitutes. Dried beans, peas, lentils, and tofu. Seeds and most nuts. Dairy Low-fat or nonfat cheeses, including ricotta and mozzarella. Skim or 1% milk that is liquid, powdered, or evaporated. Buttermilk that is made with low-fat milk. Nonfat or low-fat yogurt. Fats and oils Non-hydrogenated (trans-free) margarines. Vegetable oils, including soybean, sesame, sunflower, olive, peanut, safflower, corn, canola, and cottonseed. Salad dressings or mayonnaise made with a vegetable oil. Beverages Mineral water. Coffee and tea. Diet carbonated beverages. Sweets and desserts Sherbet, gelatin, and fruit ice. Small amounts of dark chocolate. Limit all sweets and desserts. Seasonings and condiments All seasonings and condiments. The items listed above may not be a complete list of foods and drinks you can eat. Contact a dietitian for more options. What foods should I avoid? Fruits Canned fruit in heavy syrup. Fruit in cream or butter sauce. Fried fruit. Limit coconut. Vegetables Vegetables  cooked in cheese, cream, or butter sauce. Fried vegetables. Grains Breads that are made with saturated or trans fats, oils, or whole milk. Croissants. Sweet rolls. Donuts. High-fat crackers, such as cheese crackers. Meats and other proteins Fatty meats, such as hot dogs, ribs, sausage, bacon, rib-eye roast or steak. High-fat deli meats, such as salami and bologna. Caviar. Domestic duck and goose. Organ meats, such as liver. Dairy Cream, sour cream, cream cheese, and creamed cottage cheese. Whole-milk cheeses. Whole or 2% milk that is liquid, evaporated, or condensed. Whole buttermilk. Cream sauce or high-fat cheese sauce. Yogurt that is made from whole milk. Fats and oils Meat fat, or shortening. Cocoa butter, hydrogenated oils, palm oil, coconut oil, palm kernel oil. Solid fats and shortenings, including bacon fat, salt pork, lard, and butter. Nondairy cream substitutes. Salad dressings with cheese or sour cream. Beverages Regular sodas and juice drinks with added sugar. Sweets and desserts Frosting. Pudding. Cookies. Cakes. Pies. Milk chocolate or white chocolate. Buttered syrups.  Full-fat ice cream or ice cream drinks. The items listed above may not be a complete list of foods and drinks to avoid. Contact a dietitian for more information. Summary Heart-healthy meal planning includes eating less unhealthy fats, eating more healthy fats, and making other changes in your diet. Eat a balanced diet. This includes fruits and vegetables, low-fat or nonfat dairy, lean protein, nuts and legumes, whole grains, and heart-healthy oils and fats. This information is not intended to replace advice given to you by your health care provider. Make sure you discuss any questions you have with your health care provider. Document Revised: 08/26/2021 Document Reviewed: 08/26/2021 Elsevier Patient Education  2023 [employer]   Important Information About Sugar

## 2022-03-17 NOTE — Progress Notes (Unsigned)
Cardiology Office Note:    Date:  03/19/2022   ID:  Edman Circle, DOB 10-07-1965, MRN [id number]  PCP:  Shelia Pounds, PA   [facility] Providers Cardiologist:  Shelia Batty, MD     Referring MD: Shelia Pounds, PA   Chief Complaint  Patient presents with   Follow-up    Seen for Dr. Allyson Clements    History of Present Illness:    Shelia Clements is a 56 y.o. female with a hx of palpitation on low-dose beta-blocker, hypertension, hyperlipidemia, prediabetes and a family history of heart disease.  She had a negative Myoview in 2006 and normal echocardiogram 2005. Patient was seen by Dr. Allyson Clements on 05/01/2021 at which time she was doing well.  Patient presents today for follow-up.  3 weeks ago, she had an incidence where she started having throat tightness and chest tightness after eating breakfast.  The symptom was accompanied by burping.  Her PCP increased her Prilosec and her symptoms improved.  I recommended echocardiogram given atypical chest pain, if echocardiogram is normal, would not recommend any further work-up.  She is also interested in coronary calcium scoring and is aware of the $99 out-of-pocket cost.  The only thing she is concerned of is she has a goiter and wish to avoid excessive radiation of her goiter.  Her blood pressure has been elevated, she does have fatigue.  I will increase lisinopril to 40 mg daily.  She will need to follow-up in 4 to 6 weeks for blood pressure medication titration  Past Medical History:  Diagnosis Date   Carotid bruit 10/2011   History of carotid bruits in the past with negative dopplers   Degenerative joint disease    Dyslipidemia    History of stress test 2006   Low risk study   Hx of echocardiogram 2005   normal study   Hypertension    Iron deficiency anemia 2010   Never had endo/colon (Dr Loreta Ave)   Noncompliance with medications    secondary to cost   Obesity 2005   negative sleep study    Palpitation      Past Surgical History:  Procedure Laterality Date   WISDOM TOOTH EXTRACTION      Current Medications: Current Meds  Medication Sig   clobetasol ointment (TEMOVATE) 0.05 % SMARTSIG:Sparingly Topical Twice Daily PRN   diclofenac Sodium (VOLTAREN) 1 % GEL SMARTSIG:2 Topical 1-4 Times Daily   Docusate Calcium (STOOL SOFTENER PO) Take 1 capsule by mouth daily.   hydrochlorothiazide (MICROZIDE) 12.5 MG capsule Take 1 capsule (12.5 mg total) by mouth daily as needed.   iron polysaccharides (NIFEREX) 150 MG capsule Take 1 capsule (150 mg total) by mouth daily.   loratadine (CLARITIN) 10 MG tablet Take 10 mg by mouth daily.   metoprolol tartrate (LOPRESSOR) 25 MG tablet Take 25 mg by mouth 2 (two) times daily.   NP THYROID 60 MG tablet Take 60 mg by mouth every morning.   omeprazole (PRILOSEC) 40 MG capsule Take 40 mg by mouth daily.   simvastatin (ZOCOR) 40 MG tablet Take 1 tablet (40 mg total) by mouth at bedtime.   [DISCONTINUED] lisinopril (ZESTRIL) 20 MG tablet Take 1 tablet (20 mg total) by mouth daily.     Allergies:   Ferrous sulfate, Penicillins, and Sulfa antibiotics   Social History   Socioeconomic History   Marital status: Married    Spouse name: Not on file   Number of children: Not on file  Years of education: Not on file   Highest education level: Not on file  Occupational History   Not on file  Tobacco Use   Smoking status: Never   Smokeless tobacco: Never  Substance and Sexual Activity   Alcohol use: No   Drug use: No   Sexual activity: Not on file  Other Topics Concern   Not on file  Social History Narrative   Not on file   Social Determinants of Health   Financial Resource Strain: Not on file  Food Insecurity: Not on file  Transportation Needs: Not on file  Physical Activity: Not on file  Stress: Not on file  Social Connections: Not on file     Family History: The patient's family history includes Aortic stenosis in her father; CAD in her  mother; Cancer in her mother; Diabetes in her mother; Heart attack in her sister; Hypertension in her mother; Stroke in her sister.  ROS:   Please see the history of present illness.     All other systems reviewed and are negative.  EKGs/Labs/Other Studies Reviewed:    The following studies were reviewed today:  N/A  EKG:  EKG is ordered today.  The ekg ordered today demonstrates normal sinus rhythm, T wave inversion in the inferior leads, poor R wave progression in the anterior leads  Recent Labs: 09/11/2021: ALT 23; BUN 20; Creatinine, Ser 0.72; Hemoglobin 12.8; Platelets 275; Potassium 3.7; Sodium 139  Recent Lipid Panel    Component Value Date/Time   CHOL 150 06/06/2013 0925   TRIG 137 06/06/2013 0925   HDL 46 06/06/2013 0925   LDLCALC 77 06/06/2013 0925     Risk Assessment/Calculations:           Physical Exam:    VS:  BP (!) 160/78   Pulse 68   Ht 5\' 2"  (1.575 m)   Wt 236 lb 3.2 oz (107.1 kg)   SpO2 97%   BMI 43.20 kg/m        Wt Readings from Last 3 Encounters:  03/17/22 236 lb 3.2 oz (107.1 kg)  09/11/21 239 lb 10.2 oz (108.7 kg)  05/01/21 239 lb 9.6 oz (108.7 kg)     GEN:  Well nourished, well developed in no acute distress HEENT: Normal NECK: No JVD; No carotid bruits LYMPHATICS: No lymphadenopathy CARDIAC: RRR, no murmurs, rubs, gallops RESPIRATORY:  Clear to auscultation without rales, wheezing or rhonchi  ABDOMEN: Soft, non-tender, non-distended MUSCULOSKELETAL:  No edema; No deformity  SKIN: Warm and dry NEUROLOGIC:  Alert and oriented x 3 PSYCHIATRIC:  Normal affect   ASSESSMENT:    1. Atypical chest pain   2. Family history of early CAD   3. Primary hypertension   4. Hyperlipidemia LDL goal <100    PLAN:    In order of problems listed above:  Atypical chest pain: Symptom occurred 3 weeks ago and was accompanied by burping.  Her PCP has increased her Prilosec and the symptom has improved.  I recommended echocardiogram, if normal, no  further work-up  Family history of early CAD: Obtain coronary calcium score.  Hypertension: Blood pressure elevated, increase lisinopril to 40 mg daily  Hyperlipidemia: On Zocor.            Medication Adjustments/Labs and Tests Ordered: Current medicines are reviewed at length with the patient today.  Concerns regarding medicines are outlined above.  Orders Placed This Encounter  Procedures   CT CARDIAC SCORING (SELF PAY ONLY)   EKG 12-Lead   ECHOCARDIOGRAM  COMPLETE   Meds ordered this encounter  Medications   lisinopril (ZESTRIL) 40 MG tablet    Sig: Take 1 tablet (40 mg total) by mouth daily.    Dispense:  90 tablet    Refill:  3    Patient Instructions  Medication Instructions:  INCREASE Lisinopril to 40 mg daily  *If you need a refill on your cardiac medications before your next appointment, please call your pharmacy*  Lab Work: NONE ordered at this time of appointment   If you have labs (blood work) drawn today and your tests are completely normal, you will receive your results only by: MyChart Message (if you have MyChart) OR A paper copy in the mail If you have any lab test that is abnormal or we need to change your treatment, we will call you to review the results.  Testing/Procedures: Your physician has requested that you have an echocardiogram. Echocardiography is a painless test that uses sound waves to create images of your heart. It provides your doctor with information about the size and shape of your heart and how well your heart's chambers and valves are working. This procedure takes approximately one hour. There are no restrictions for this procedure.   Azalee Course, PA-C has ordered a CT coronary calcium score.   Test locations:  [facility]   This is $99 out of pocket.   Coronary CalciumScan A coronary calcium scan is an imaging test used to look for deposits of calcium and other fatty materials (plaques) in the  inner lining of the blood vessels of the heart (coronary arteries). These deposits of calcium and plaques can partly clog and narrow the coronary arteries without producing any symptoms or warning signs. This puts a person at risk for a heart attack. This test can detect these deposits before symptoms develop. Tell a health care provider about: Any allergies you have. All medicines you are taking, including vitamins, herbs, eye drops, creams, and over-the-counter medicines. Any problems you or family members have had with anesthetic medicines. Any blood disorders you have. Any surgeries you have had. Any medical conditions you have. Whether you are pregnant or may be pregnant. What are the risks? Generally, this is a safe procedure. However, problems may occur, including: Harm to a pregnant woman and her unborn baby. This test involves the use of radiation. Radiation exposure can be dangerous to a pregnant woman and her unborn baby. If you are pregnant, you generally should not have this procedure done. Slight increase in the risk of cancer. This is because of the radiation involved in the test. What happens before the procedure? No preparation is needed for this procedure. What happens during the procedure? You will undress and remove any jewelry around your neck or chest. You will put on a hospital gown. Sticky electrodes will be placed on your chest. The electrodes will be connected to an electrocardiogram (ECG) machine to record a tracing of the electrical activity of your heart. A CT scanner will take pictures of your heart. During this time, you will be asked to lie still and hold your breath for 2-3 seconds while a picture of your heart is being taken. The procedure may vary among health care providers and hospitals. What happens after the procedure? You can get dressed. You can return to your normal activities. It is up to you to get the results of your test. Ask your health care  provider, or the department that is doing the test, when  your results will be ready. Summary A coronary calcium scan is an imaging test used to look for deposits of calcium and other fatty materials (plaques) in the inner lining of the blood vessels of the heart (coronary arteries). Generally, this is a safe procedure. Tell your health care provider if you are pregnant or may be pregnant. No preparation is needed for this procedure. A CT scanner will take pictures of your heart. You can return to your normal activities after the scan is done. This information is not intended to replace advice given to you by your health care provider. Make sure you discuss any questions you have with your health care provider. Document Released: 01/17/2008 Document Revised: 06/09/2016 Document Reviewed: 06/09/2016 Elsevier Interactive Patient Education  2017 [employer].  Follow-Up: At [facility], you and your health needs are our priority.  As part of our continuing mission to provide you with exceptional heart care, we have created designated Provider Care Teams.  These Care Teams include your primary Cardiologist (physician) and Advanced Practice Providers (APPs -  Physician Assistants and Nurse Practitioners) who all work together to provide you with the care you need, when you need it.  We recommend signing up for the patient portal called "MyChart".  Sign up information is provided on this After Visit Summary.  MyChart is used to connect with patients for Virtual Visits (Telemedicine).  Patients are able to view lab/test results, encounter notes, upcoming appointments, etc.  Non-urgent messages can be sent to your provider as well.   To learn more about what you can do with MyChart, go to [email or web address].    Your next appointment:   4-6 week(s)  The format for your next appointment:   In Person  Provider:   APP         Other Instructions  Heart-Healthy Eating Plan Eating a healthy  diet is important for the health of your heart. A heart-healthy eating plan includes: Eating less unhealthy fats. Eating more healthy fats. Eating less salt in your food. Salt is also called sodium. Making other changes in your diet. Talk with your doctor or a diet specialist (dietitian) to create an eating plan that is right for you. What is my plan? Your doctor may recommend an eating plan that includes: Total fat: ______% or less of total calories a day. Saturated fat: ______% or less of total calories a day. Cholesterol: less than _________mg a day. Sodium: less than _________mg a day. What are tips for following this plan? Cooking Avoid frying your food. Try to bake, boil, grill, or broil it instead. You can also reduce fat by: Removing the skin from poultry. Removing all visible fats from meats. Steaming vegetables in water or broth. Meal planning  At meals, divide your plate into four equal parts: Fill one-half of your plate with vegetables and green salads. Fill one-fourth of your plate with whole grains. Fill one-fourth of your plate with lean protein foods. Eat 2-4 cups of vegetables per day. One cup of vegetables is: 1 cup (91 g) broccoli or cauliflower florets. 2 medium carrots. 1 large bell pepper. 1 large sweet potato. 1 large tomato. 1 medium white potato. 2 cups (150 g) raw leafy greens. Eat 1-2 cups of fruit per day. One cup of fruit is: 1 small apple 1 large banana 1 cup (237 g) mixed fruit, 1 large orange,  cup (82 g) dried fruit, 1 cup (240 mL) 100% fruit juice. Eat more foods that have soluble fiber. These are  apples, broccoli, carrots, beans, peas, and barley. Try to get 20-30 g of fiber per day. Eat 4-5 servings of nuts, legumes, and seeds per week: 1 serving of dried beans or legumes equals  cup (90 g) cooked. 1 serving of nuts is  oz (12 almonds, 24 pistachios, or 7 walnut halves). 1 serving of seeds equals  oz (8 g). General  information Eat more home-cooked food. Eat less restaurant, buffet, and fast food. Limit or avoid alcohol. Limit foods that are high in starch and sugar. Avoid fried foods. Lose weight if you are overweight. Keep track of how much salt (sodium) you eat. This is important if you have high blood pressure. Ask your doctor to tell you more about this. Try to add vegetarian meals each week. Fats Choose healthy fats. These include olive oil and canola oil, flaxseeds, walnuts, almonds, and seeds. Eat more omega-3 fats. These include salmon, mackerel, sardines, tuna, flaxseed oil, and ground flaxseeds. Try to eat fish at least 2 times each week. Check food labels. Avoid foods with trans fats or high amounts of saturated fat. Limit saturated fats. These are often found in animal products, such as meats, butter, and cream. These are also found in plant foods, such as palm oil, palm kernel oil, and coconut oil. Avoid foods with partially hydrogenated oils in them. These have trans fats. Examples are stick margarine, some tub margarines, cookies, crackers, and other baked goods. What foods should I eat? Fruits All fresh, canned (in natural juice), or frozen fruits. Vegetables Fresh or frozen vegetables (raw, steamed, roasted, or grilled). Green salads. Grains Most grains. Choose whole wheat and whole grains most of the time. Rice and pasta, including brown rice and pastas made with whole wheat. Meats and other proteins Lean, well-trimmed beef, veal, pork, and lamb. Chicken and [place] without skin. All fish and shellfish. Wild duck, rabbit, pheasant, and venison. Egg whites or low-cholesterol egg substitutes. Dried beans, peas, lentils, and tofu. Seeds and most nuts. Dairy Low-fat or nonfat cheeses, including ricotta and mozzarella. Skim or 1% milk that is liquid, powdered, or evaporated. Buttermilk that is made with low-fat milk. Nonfat or low-fat yogurt. Fats and oils Non-hydrogenated (trans-free)  margarines. Vegetable oils, including soybean, sesame, sunflower, olive, peanut, safflower, corn, canola, and cottonseed. Salad dressings or mayonnaise made with a vegetable oil. Beverages Mineral water. Coffee and tea. Diet carbonated beverages. Sweets and desserts Sherbet, gelatin, and fruit ice. Small amounts of dark chocolate. Limit all sweets and desserts. Seasonings and condiments All seasonings and condiments. The items listed above may not be a complete list of foods and drinks you can eat. Contact a dietitian for more options. What foods should I avoid? Fruits Canned fruit in heavy syrup. Fruit in cream or butter sauce. Fried fruit. Limit coconut. Vegetables Vegetables cooked in cheese, cream, or butter sauce. Fried vegetables. Grains Breads that are made with saturated or trans fats, oils, or whole milk. Croissants. Sweet rolls. Donuts. High-fat crackers, such as cheese crackers. Meats and other proteins Fatty meats, such as hot dogs, ribs, sausage, bacon, rib-eye roast or steak. High-fat deli meats, such as salami and bologna. Caviar. Domestic duck and goose. Organ meats, such as liver. Dairy Cream, sour cream, cream cheese, and creamed cottage cheese. Whole-milk cheeses. Whole or 2% milk that is liquid, evaporated, or condensed. Whole buttermilk. Cream sauce or high-fat cheese sauce. Yogurt that is made from whole milk. Fats and oils Meat fat, or shortening. Cocoa butter, hydrogenated oils, palm oil, coconut oil, palm  kernel oil. Solid fats and shortenings, including bacon fat, salt pork, lard, and butter. Nondairy cream substitutes. Salad dressings with cheese or sour cream. Beverages Regular sodas and juice drinks with added sugar. Sweets and desserts Frosting. Pudding. Cookies. Cakes. Pies. Milk chocolate or white chocolate. Buttered syrups. Full-fat ice cream or ice cream drinks. The items listed above may not be a complete list of foods and drinks to avoid. Contact a  dietitian for more information. Summary Heart-healthy meal planning includes eating less unhealthy fats, eating more healthy fats, and making other changes in your diet. Eat a balanced diet. This includes fruits and vegetables, low-fat or nonfat dairy, lean protein, nuts and legumes, whole grains, and heart-healthy oils and fats. This information is not intended to replace advice given to you by your health care provider. Make sure you discuss any questions you have with your health care provider. Document Revised: 08/26/2021 Document Reviewed: 08/26/2021 Elsevier Patient Education  2023 [employer].   Important Information About Marvis Repress, [place]  03/19/2022 11:29 PM    [facility]

## 2022-03-19 ENCOUNTER — Ambulatory Visit: Payer: Self-pay

## 2022-03-19 ENCOUNTER — Encounter: Payer: Self-pay | Admitting: Physician Assistant

## 2022-03-21 ENCOUNTER — Telehealth: Payer: Self-pay | Admitting: Cardiovascular Disease

## 2022-03-21 NOTE — Telephone Encounter (Signed)
Pt c/o medication issue:  1. Name of Medication:  lisinopril (ZESTRIL) 40 MG tablet  2. How are you currently taking this medication (dosage and times per day)?  As advised, 1 tablet (40 mg total) by mouth daily  3. Are you having a reaction (difficulty breathing--STAT)?   4. What is your medication issue?   Patient states with doubling Lisinopril her BP has been down to 112/62, 117/67, 119/61. She would like to know whether this is normal or too low. Please advise. She requested a call back on her cell phone at [phone] if unable to contact her on her home phone.

## 2022-03-21 NOTE — Telephone Encounter (Signed)
Returned the call to the patient. She was calling to give blood pressure readings after the increase in Lisinopril. She is now taking 40 mg once daily. She has been checking her blood pressure multiples times an hour. She has been given education on how to take your blood pressure and how often. She will do this and keep a record.   Blood pressure while on the phone was 112/60.

## 2022-04-08 ENCOUNTER — Other Ambulatory Visit (HOSPITAL_COMMUNITY): Payer: Self-pay

## 2022-04-18 ENCOUNTER — Ambulatory Visit: Payer: Self-pay | Attending: Physician Assistant

## 2022-04-18 DIAGNOSIS — R0789 Other chest pain: Secondary | ICD-10-CM

## 2022-04-18 LAB — ECHOCARDIOGRAM COMPLETE
AR max vel: 2.46 cm2
AV Area VTI: 2.91 cm2
AV Area mean vel: 2.95 cm2
AV Mean grad: 3 mmHg
AV Peak grad: 7.7 mmHg
Ao pk vel: 1.39 m/s
Area-P 1/2: 3.15 cm2
S' Lateral: 2.6 cm

## 2022-04-21 ENCOUNTER — Ambulatory Visit (HOSPITAL_COMMUNITY)
Admission: RE | Admit: 2022-04-21 | Discharge: 2022-04-21 | Disposition: A | Discharge status: Home / Self Care | Payer: Self-pay | Source: Ambulatory Visit | Attending: Physician Assistant | Admitting: Physician Assistant

## 2022-04-21 DIAGNOSIS — Z8249 Family history of ischemic heart disease and other diseases of the circulatory system: Secondary | ICD-10-CM | POA: Insufficient documentation

## 2022-04-22 ENCOUNTER — Ambulatory Visit: Payer: Self-pay

## 2022-04-28 ENCOUNTER — Encounter: Payer: Self-pay | Admitting: Physician Assistant

## 2022-04-28 ENCOUNTER — Ambulatory Visit: Payer: Self-pay | Attending: Physician Assistant | Admitting: Physician Assistant

## 2022-04-28 VITALS — BP 136/82 | HR 78 | Ht 62.0 in | Wt 237.4 lb

## 2022-04-28 DIAGNOSIS — R0789 Other chest pain: Secondary | ICD-10-CM

## 2022-04-28 DIAGNOSIS — I10 Essential (primary) hypertension: Secondary | ICD-10-CM

## 2022-04-28 DIAGNOSIS — E785 Hyperlipidemia, unspecified: Secondary | ICD-10-CM

## 2022-04-28 NOTE — Progress Notes (Unsigned)
Cardiology Office Note:    Date:  04/30/2022   ID:  Edman Circle, DOB 17-Feb-1966, MRN [id number]  PCP:  Gildardo Pounds, PA   [facility] Providers Cardiologist:  Nanetta Batty, MD     Referring MD: Gildardo Pounds, PA   Chief Complaint  Patient presents with   Follow-up    Seen for Dr. Allyson Sabal    History of Present Illness:    Shelia Clements is a 56 y.o. female with a hx of palpitation on low-dose beta-blocker, hypertension, hyperlipidemia, prediabetes and a family history of heart disease.  She had a negative Myoview in 2006 and normal echocardiogram 2005. Patient was seen by Dr. Allyson Sabal on 05/01/2021 at which time she was doing well.  I last saw the patient on 03/17/2022, 3 weeks prior to that, she had an episode of throat tightness in the chest discomfort after eating breakfast.  Symptom was accompanied by burping.  Her PCP increased her Prilosec and that her symptoms improved.  I recommended echocardiogram and a coronary calcium score.  Echocardiogram came back normal with EF 60 to 65%, no regional wall motion abnormality, grade 1 DD, no significant valve disease.  Coronary calcium came back with calcium score of 5 which placed the patient 80th percentile for age and sex matched control.  Patient presents today for follow-up.  She denies any recent chest pain or worsening dyspnea.  We explained that she has elevated coronary calcium score, however she has normal coronary arteries.  Therefore I suspect her previous throat pain is more GI in nature rather than cardiac since it was accompanied by burping and improved with increasing Prilosec.  Given lack of anginal symptom, I decided to hold off on any additional testing.  Given elevated coronary calcium score I recommended better control of blood pressure and heart rate.  Since I increased her blood pressure medication last time, her blood pressure is very well controlled in the 110s to low 130s at home based on  blood pressure diary.  We will request the last set of cholesterol panel from her PCPs office, given elevated calcium score and family history and her risk factors, I would recommend aiming for LDL goal of less than 70.  Otherwise, she can follow-up in 4 to 6 months.  Past Medical History:  Diagnosis Date   Carotid bruit 10/2011   History of carotid bruits in the past with negative dopplers   Degenerative joint disease    Dyslipidemia    History of stress test 2006   Low risk study   Hx of echocardiogram 2005   normal study   Hypertension    Iron deficiency anemia 2010   Never had endo/colon (Dr Loreta Ave)   Noncompliance with medications    secondary to cost   Obesity 2005   negative sleep study    Palpitation     Past Surgical History:  Procedure Laterality Date   WISDOM TOOTH EXTRACTION      Current Medications: Current Meds  Medication Sig   clobetasol ointment (TEMOVATE) 0.05 % SMARTSIG:Sparingly Topical Twice Daily PRN   diclofenac Sodium (VOLTAREN) 1 % GEL SMARTSIG:2 Topical 1-4 Times Daily   Docusate Calcium (STOOL SOFTENER PO) Take 1 capsule by mouth daily.   hydrochlorothiazide (MICROZIDE) 12.5 MG capsule Take 1 capsule (12.5 mg total) by mouth daily as needed.   iron polysaccharides (NIFEREX) 150 MG capsule Take 1 capsule (150 mg total) by mouth daily.   lisinopril (ZESTRIL) 40 MG tablet Take  1 tablet (40 mg total) by mouth daily.   loratadine (CLARITIN) 10 MG tablet Take 10 mg by mouth daily.   metoprolol tartrate (LOPRESSOR) 25 MG tablet Take 25 mg by mouth 2 (two) times daily.   NP THYROID 60 MG tablet Take 60 mg by mouth every morning.   omeprazole (PRILOSEC) 40 MG capsule Take 40 mg by mouth daily.   simvastatin (ZOCOR) 40 MG tablet Take 1 tablet (40 mg total) by mouth at bedtime.     Allergies:   Ferrous sulfate, Penicillins, and Sulfa antibiotics   Social History   Socioeconomic History   Marital status: Married    Spouse name: Not on file   Number of  children: Not on file   Years of education: Not on file   Highest education level: Not on file  Occupational History   Not on file  Tobacco Use   Smoking status: Never   Smokeless tobacco: Never  Substance and Sexual Activity   Alcohol use: No   Drug use: No   Sexual activity: Not on file  Other Topics Concern   Not on file  Social History Narrative   Not on file   Social Determinants of Health   Financial Resource Strain: Not on file  Food Insecurity: Not on file  Transportation Needs: Not on file  Physical Activity: Not on file  Stress: Not on file  Social Connections: Not on file     Family History: The patient's family history includes Aortic stenosis in her father; CAD in her mother; Cancer in her mother; Diabetes in her mother; Heart attack in her sister; Hypertension in her mother; Stroke in her sister.  ROS:   Please see the history of present illness.     All other systems reviewed and are negative.  EKGs/Labs/Other Studies Reviewed:    The following studies were reviewed today:  Echo 04/18/2022 1. Left ventricular ejection fraction, by estimation, is 60 to 65%. The  left ventricle has normal function. The left ventricle has no regional  wall motion abnormalities. Left ventricular diastolic parameters are  consistent with Grade I diastolic  dysfunction (impaired relaxation).   2. Right ventricular systolic function is normal. The right ventricular  size is normal. Tricuspid regurgitation signal is inadequate for assessing  PA pressure.   3. Left atrial size was mildly dilated.   4. The mitral valve is normal in structure. No evidence of mitral valve  regurgitation. No evidence of mitral stenosis.   5. The aortic valve is normal in structure. Aortic valve regurgitation is  not visualized. No aortic stenosis is present.   6. The inferior vena cava is normal in size with greater than 50%  respiratory variability, suggesting right atrial pressure of 3 mmHg.    Comparison(s): EF 60%.   EKG:  EKG is not ordered today.    Recent Labs: 09/11/2021: ALT 23; BUN 20; Creatinine, Ser 0.72; Hemoglobin 12.8; Platelets 275; Potassium 3.7; Sodium 139  Recent Lipid Panel    Component Value Date/Time   CHOL 150 06/06/2013 0925   TRIG 137 06/06/2013 0925   HDL 46 06/06/2013 0925   LDLCALC 77 06/06/2013 0925     Risk Assessment/Calculations:           Physical Exam:    VS:  BP 136/82   Pulse 78   Ht 5\' 2"  (1.575 m)   Wt 237 lb 6.4 oz (107.7 kg)   SpO2 99%   BMI 43.42 kg/m  Wt Readings from Last 3 Encounters:  04/28/22 237 lb 6.4 oz (107.7 kg)  03/17/22 236 lb 3.2 oz (107.1 kg)  09/11/21 239 lb 10.2 oz (108.7 kg)     GEN:  Well nourished, well developed in no acute distress HEENT: Normal NECK: No JVD; No carotid bruits LYMPHATICS: No lymphadenopathy CARDIAC: RRR, no murmurs, rubs, gallops RESPIRATORY:  Clear to auscultation without rales, wheezing or rhonchi  ABDOMEN: Soft, non-tender, non-distended MUSCULOSKELETAL:  No edema; No deformity  SKIN: Warm and dry NEUROLOGIC:  Alert and oriented x 3 PSYCHIATRIC:  Normal affect   ASSESSMENT:    1. Atypical chest pain   2. Primary hypertension   3. Hyperlipidemia LDL goal <70    PLAN:    In order of problems listed above:  Atypical chest pain: Although she had elevated coronary calcium score, she really does not have any obvious anginal symptom especially exertional symptom.  I recommended continue observation.  Hypertension: Systolic blood pressure has been between 110-130s at home  Hyperlipidemia: Given her risk factors and the elevated calcium score, I would recommend aiming for LDL goal of less than 70.  We will request the last set of lipid panel from her PCPs office.           Medication Adjustments/Labs and Tests Ordered: Current medicines are reviewed at length with the patient today.  Concerns regarding medicines are outlined above.  No orders of the  defined types were placed in this encounter.  No orders of the defined types were placed in this encounter.   Patient Instructions  Medication Instructions:  Your physician recommends that you continue on your current medications as directed. Please refer to the Current Medication list given to you today.  *If you need a refill on your cardiac medications before your next appointment, please call your pharmacy*   Lab Work: NONE If you have labs (blood work) drawn today and your tests are completely normal, you will receive your results only by: MyChart Message (if you have MyChart) OR A paper copy in the mail If you have any lab test that is abnormal or we need to change your treatment, we will call you to review the results.   Testing/Procedures: NONE   Follow-Up: At [facility], you and your health needs are our priority.  As part of our continuing mission to provide you with exceptional heart care, we have created designated Provider Care Teams.  These Care Teams include your primary Cardiologist (physician) and Advanced Practice Providers (APPs -  Physician Assistants and Nurse Practitioners) who all work together to provide you with the care you need, when you need it.  We recommend signing up for the patient portal called "MyChart".  Sign up information is provided on this After Visit Summary.  MyChart is used to connect with patients for Virtual Visits (Telemedicine).  Patients are able to view lab/test results, encounter notes, upcoming appointments, etc.  Non-urgent messages can be sent to your provider as well.   To learn more about what you can do with MyChart, go to [email or web address].    Your next appointment:   4-6 month(s)  The format for your next appointment:   In Person  Provider:   Nanetta Batty, MD     Signed, Azalee Course, [place]  04/30/2022 11:43 PM    [facility]

## 2022-04-28 NOTE — Patient Instructions (Signed)
Medication Instructions:  Your physician recommends that you continue on your current medications as directed. Please refer to the Current Medication list given to you today.  *If you need a refill on your cardiac medications before your next appointment, please call your pharmacy*   Lab Work: NONE If you have labs (blood work) drawn today and your tests are completely normal, you will receive your results only by: MyChart Message (if you have MyChart) OR A paper copy in the mail If you have any lab test that is abnormal or we need to change your treatment, we will call you to review the results.   Testing/Procedures: NONE   Follow-Up: At [facility], you and your health needs are our priority.  As part of our continuing mission to provide you with exceptional heart care, we have created designated Provider Care Teams.  These Care Teams include your primary Cardiologist (physician) and Advanced Practice Providers (APPs -  Physician Assistants and Nurse Practitioners) who all work together to provide you with the care you need, when you need it.  We recommend signing up for the patient portal called "MyChart".  Sign up information is provided on this After Visit Summary.  MyChart is used to connect with patients for Virtual Visits (Telemedicine).  Patients are able to view lab/test results, encounter notes, upcoming appointments, etc.  Non-urgent messages can be sent to your provider as well.   To learn more about what you can do with MyChart, go to [email or web address].    Your next appointment:   4-6 month(s)  The format for your next appointment:   In Person  Provider:   Jonathan Berry, MD    

## 2022-04-30 ENCOUNTER — Encounter: Payer: Self-pay | Admitting: Physician Assistant

## 2022-04-30 ENCOUNTER — Telehealth: Payer: Self-pay | Admitting: Licensed Clinical Social Worker

## 2022-04-30 NOTE — Telephone Encounter (Signed)
H&V Care Navigation CSW Progress Note  Clinical Social Worker contacted patient by phone to complete SDOH Screening. Note pt listed as currently uninsured. LCSW attempted pt at [phone], no answer and voicemail requests "remote access code" and does not give option to leave message. Will re-attempt as able.   Patient is participating in a Managed Medicaid Plan:  No, noted currently as self pay.   SDOH Screenings   Tobacco Use: Low Risk  (04/28/2022)    [name], MSW, [place]  [phone]- work cell phone (preferred) [phone]- desk phone

## 2022-05-01 ENCOUNTER — Telehealth: Payer: Self-pay | Admitting: Licensed Clinical Social Worker

## 2022-05-01 NOTE — Progress Notes (Signed)
Heart and Vascular Care Navigation  05/01/2022  Keilly Fatula Apr 28, 1966 [id number]  Reason for Referral:  Patient is participating in a Managed Medicaid Plan:No, self pay only  Engaged with patient by telephone for initial visit for Heart and Vascular Care Coordination.                                                                                                   Assessment:                                     LCSW received a call back from pt- introduced self, role, reason for call. Pt confirmed home address, lives with her husband, both employed. Confirms she sees provider (PCP) at [facility] and obtains her medications at [facility]. She does not currently have any insurance. She shares that she and her husband may make too much to be considered for [employer]- I still encouraged her to apply and provided some information about levels of eligibility. I also shared information about medication access program in [place] and encouraged her to apply for that as well. Pt denies any additional questions for this writer, agreeable to applications being sent.  She has a question for provider. They requested she get her labs sent from PCP to office for medication recs. She received a call from PCP requesting she fill out ROI for them to send labs. She wont be able to do this until next week. Shares she personally has the labs and could fax them over or give them verbally to staff. I will route this to provider/CMA who worked with pt to f/u about how best to get those labs.   HRT/VAS Care Coordination     Patients Home Cardiology Office [facility]   Outpatient Care Team Social Worker   Social Worker Name: Nile Riggs, [place] [phone]   Living arrangements for the past 2 months Single Family Home   Lives with: Spouse   Patient Current Insurance Coverage Self-Pay   Patient Has Concern With Paying Medical Bills Yes   Patient Concerns  With Medical Bills no insurance   Medical Bill Referrals: [facility]   Does Patient Have Prescription Coverage? No   Patient Prescription Assistance Programs Other   Other Assistance Programs Medications mailed application for [facility]       Social History:                                                                             SDOH Screenings   Food Insecurity: No Food Insecurity (05/01/2022)  Housing: Low Risk  (05/01/2022)  Transportation Needs: No Transportation Needs (05/01/2022)  Utilities: Not At Risk (05/01/2022)  Financial Resource  Strain: Medium Risk (05/01/2022)  Tobacco Use: Low Risk  (04/30/2022)    SDOH Interventions: Financial Resources:  Financial Strain Interventions: Other (Comment) (mailed CAFA and medication management clinic application) Financial Counseling for [place] Insecurity:  Food Insecurity Interventions: Intervention Not Indicated  Housing Insecurity:  Housing Interventions: Intervention Not Indicated  Transportation:   Transportation Interventions: Intervention Not Indicated    Other Care Navigation Interventions:     Provided Pharmacy assistance resources Other   Follow-up plan:   LCSW has mailed pt the following: my card, Sport and exercise psychologist, and Medication Access Program for [facility].

## 2022-05-01 NOTE — Telephone Encounter (Signed)
H&V Care Navigation CSW Progress Note   Clinical Social Worker contacted patient for a second time by phone to complete SDOH Screening. Note pt listed as currently uninsured. LCSW attempted [phone], again, no answer and voicemail requests "remote access code" and does not give option to leave message. This is also the number listed for her spouse. Will mail pt a copy of [facility] ([place]) to listed home address with my card. I remain available.    Patient is participating in a Managed Medicaid Plan:  No, noted currently as self pay.   SDOH Screenings   Tobacco Use: Low Risk  (04/30/2022)   [name], MSW, [place]  [phone]- work cell phone (preferred) [phone]- desk phone

## 2022-05-21 ENCOUNTER — Telehealth: Payer: Self-pay | Admitting: Physician Assistant

## 2022-05-21 NOTE — Telephone Encounter (Signed)
Called patient's PCP office and asked for labs to be faxed to my attention.

## 2022-05-21 NOTE — Telephone Encounter (Signed)
Has this been handled ? Called patient just now was unable to reach her or leave a VM I will follow up again

## 2022-05-21 NOTE — Telephone Encounter (Signed)
Received fax from patient's PCP containing labs

## 2022-05-21 NOTE — Telephone Encounter (Signed)
Spoke with patient she still hasn't been able to get [place] the cholesterol labs that Craig requested. She states that her PCP is requiring that she sign a ROI but she's not driving all the way out there to sign a piece of paper when she have the labs on her phone. She then stated that she would take a screen shot from them and try to send them to [place] via My chart BUT if she can't do that then she would take her time and type everything from the labs off her computer and send them.

## 2022-05-21 NOTE — Telephone Encounter (Signed)
Unable to reach her, [facility]

## 2022-05-21 NOTE — Telephone Encounter (Addendum)
Reviewed outside lab.  Total cholesterol 141, triglyceride 81, HDL 50, calculated LDL 75.  Cholesterol is really not bad.  I initially recommended LDL goal of less than 70 given elevated calcium score.  However since her LDL is close to 70 and HDL is good, I would recommend continue on the simvastatin and do diet and exercise to try to lower the LDL.  Obtain repeat fasting lipid panel in 6 months.

## 2022-05-21 NOTE — Telephone Encounter (Signed)
Unable to reach patient, unable to leave VM. If patient calls I was calling to inform her on new information in regards to the labs.

## 2022-05-30 ENCOUNTER — Other Ambulatory Visit: Payer: Self-pay

## 2022-05-30 DIAGNOSIS — Z1231 Encounter for screening mammogram for malignant neoplasm of breast: Secondary | ICD-10-CM

## 2022-06-02 ENCOUNTER — Encounter (INDEPENDENT_AMBULATORY_CARE_PROVIDER_SITE_OTHER): Payer: Self-pay

## 2022-06-03 ENCOUNTER — Ambulatory Visit: Payer: Self-pay

## 2022-06-05 ENCOUNTER — Telehealth: Payer: Self-pay | Admitting: Cardiovascular Disease

## 2022-06-05 NOTE — Telephone Encounter (Signed)
Pt is requesting call back in regards to labs.  

## 2022-06-05 NOTE — Telephone Encounter (Addendum)
Patient stated she is having a hard time getting her PCP to fax blood work results from June 2023 to our clinic. Patient stated she is having more blood work on Monday and will ask for those results faxed to our clinic for comparison. I called PCP's office [phone] and requested lab results  from June 2023 to be faxed to [phone]. Received labs from June 2023. Copy handed to Ellison Carwin and copy sent for scanning.

## 2022-07-03 ENCOUNTER — Other Ambulatory Visit: Payer: Self-pay

## 2022-07-03 DIAGNOSIS — E785 Hyperlipidemia, unspecified: Secondary | ICD-10-CM

## 2022-07-17 ENCOUNTER — Ambulatory Visit: Payer: Self-pay

## 2022-07-17 DIAGNOSIS — Z1211 Encounter for screening for malignant neoplasm of colon: Secondary | ICD-10-CM

## 2022-07-22 ENCOUNTER — Ambulatory Visit: Payer: Self-pay

## 2022-08-10 ENCOUNTER — Other Ambulatory Visit: Payer: Self-pay | Admitting: Cardiovascular Disease

## 2022-08-29 ENCOUNTER — Ambulatory Visit: Payer: Self-pay | Admitting: Cardiovascular Disease

## 2022-09-02 ENCOUNTER — Ambulatory Visit: Payer: Self-pay

## 2022-09-02 ENCOUNTER — Telehealth: Payer: Self-pay | Admitting: Physician Assistant

## 2022-10-01 ENCOUNTER — Encounter: Payer: Self-pay | Admitting: Cardiovascular Disease

## 2022-10-01 ENCOUNTER — Ambulatory Visit
Payer: No Typology Code available for payment source | Attending: Cardiovascular Disease | Admitting: Cardiovascular Disease

## 2022-10-01 VITALS — BP 136/84 | HR 67 | Ht 60.0 in | Wt 230.2 lb

## 2022-10-01 DIAGNOSIS — E785 Hyperlipidemia, unspecified: Secondary | ICD-10-CM

## 2022-10-01 DIAGNOSIS — I10 Essential (primary) hypertension: Secondary | ICD-10-CM

## 2022-10-01 DIAGNOSIS — R931 Abnormal findings on diagnostic imaging of heart and coronary circulation: Secondary | ICD-10-CM | POA: Insufficient documentation

## 2022-10-01 DIAGNOSIS — Z8249 Family history of ischemic heart disease and other diseases of the circulatory system: Secondary | ICD-10-CM

## 2022-10-01 MED ORDER — SIMVASTATIN 40 MG PO TABS: 40.0000 mg | ORAL_TABLET | Freq: Every day | ORAL | 3 refills | Status: DC

## 2022-10-01 MED ORDER — LISINOPRIL 40 MG PO TABS: 40.0000 mg | ORAL_TABLET | Freq: Every day | ORAL | 3 refills | Status: DC

## 2022-10-01 MED ORDER — OMEPRAZOLE 40 MG PO CPDR: 40.0000 mg | DELAYED_RELEASE_CAPSULE | Freq: Every day | ORAL | 3 refills | Status: AC

## 2022-10-01 MED ORDER — METOPROLOL TARTRATE 25 MG PO TABS: ORAL_TABLET | ORAL | 3 refills | Status: DC

## 2022-10-01 NOTE — Patient Instructions (Signed)
Medication Instructions:  Your physician recommends that you continue on your current medications as directed. Please refer to the Current Medication list given to you today.  *If you need a refill on your cardiac medications before your next appointment, please call your pharmacy*   Follow-Up: At [facility], you and your health needs are our priority.  As part of our continuing mission to provide you with exceptional heart care, we have created designated Provider Care Teams.  These Care Teams include your primary Cardiologist (physician) and Advanced Practice Providers (APPs -  Physician Assistants and Nurse Practitioners) who all work together to provide you with the care you need, when you need it.  We recommend signing up for the patient portal called "MyChart".  Sign up information is provided on this After Visit Summary.  MyChart is used to connect with patients for Virtual Visits (Telemedicine).  Patients are able to view lab/test results, encounter notes, upcoming appointments, etc.  Non-urgent messages can be sent to your provider as well.   To learn more about what you can do with MyChart, go to [email or web address].    Your next appointment:   1 year(s)  Provider:   Almyra Deforest, PA-C      Then, Quay Burow, MD will plan to see you again in 2 year(s).

## 2022-10-01 NOTE — Assessment & Plan Note (Signed)
Coronary Score performed 04/21/2022 was 5, essentially no evidence of coronary artery disease.

## 2022-10-01 NOTE — Assessment & Plan Note (Signed)
History of dyslipidemia on statin therapy followed by her PCP 

## 2022-10-01 NOTE — Assessment & Plan Note (Signed)
History of send hypertension a blood pressure measured today at 136/84.  She is on hydrochlorothiazide, lisinopril and metoprolol.

## 2022-10-01 NOTE — Progress Notes (Signed)
10/01/2022 Shelia Clements   [birth year]  [id number]  Primary Physician [place]ford [place], [place] Primary Cardiologist: Lorretta Harp MD Garret Reddish, [place], [place]  HPI:  Shelia Clements is a 57 y.o.  morbidly overweight married Caucasian female with no children who I last saw in the office 05/01/2021. She has seen Kerin Ransom [facility] several times since that since then. Her problems include a history of palpitations controlled on low dose beta blocker, hypertension, hyperlipidemia and family history of heart disease. She denies chest pain or shortness of breath. She had a negative Myoview many years ago.   Since I saw her a year ago she continues to do well.  She no longer has palpitations, chest pain or shortness of breath.  A 2D echo performed 04/18/2022 was essentially normal except for grade 1 diastolic dysfunction.  Coronary calcium score performed 04/21/2022 was 5.   Current Meds  Medication Sig   clobetasol ointment (TEMOVATE) 0.05 % SMARTSIG:Sparingly Topical Twice Daily PRN   diclofenac Sodium (VOLTAREN) 1 % GEL SMARTSIG:2 Topical 1-4 Times Daily   Docusate Calcium (STOOL SOFTENER PO) Take 1 capsule by mouth daily.   hydrochlorothiazide (MICROZIDE) 12.5 MG capsule Take 1 capsule (12.5 mg total) by mouth daily as needed.   iron polysaccharides (NIFEREX) 150 MG capsule Take 1 capsule (150 mg total) by mouth daily.   lisinopril (ZESTRIL) 40 MG tablet Take 1 tablet (40 mg total) by mouth daily.   loratadine (CLARITIN) 10 MG tablet Take 10 mg by mouth daily.   metoprolol tartrate (LOPRESSOR) 25 MG tablet TAKE 1 & 1/2 (ONE & ONE-HALF) TABLETS BY MOUTH TWICE DAILY   NP THYROID 60 MG tablet Take 60 mg by mouth every morning.   omeprazole (PRILOSEC) 40 MG capsule Take 40 mg by mouth daily.   simvastatin (ZOCOR) 40 MG tablet Take 1 tablet (40 mg total) by mouth at bedtime.     Allergies  Allergen Reactions   Ferrous Sulfate    Penicillins     Unknown reaction   Sulfa Antibiotics  Hives    Hives     Social History   Socioeconomic History   Marital status: Married    Spouse name: Not on file   Number of children: Not on file   Years of education: Not on file   Highest education level: Not on file  Occupational History   Not on file  Tobacco Use   Smoking status: Never   Smokeless tobacco: Never  Substance and Sexual Activity   Alcohol use: No   Drug use: No   Sexual activity: Not on file  Other Topics Concern   Not on file  Social History Narrative   Not on file   Social Determinants of Health   Financial Resource Strain: Medium Risk (05/01/2022)   Overall Financial Resource Strain (CARDIA)    Difficulty of Paying Living Expenses: Somewhat hard  Food Insecurity: No Food Insecurity (05/01/2022)   Hunger Vital Sign    Worried About Running Out of Food in the Last Year: Never true    Ran Out of Food in the Last Year: Never true  Transportation Needs: No Transportation Needs (05/01/2022)   PRAPARE - Hydrologist (Medical): No    Lack of Transportation (Non-Medical): No  Physical Activity: Not on file  Stress: Not on file  Social Connections: Not on file  Intimate Partner Violence: Not on file     Review of Systems: General: negative for  chills, fever, night sweats or weight changes.  Cardiovascular: negative for chest pain, dyspnea on exertion, edema, orthopnea, palpitations, paroxysmal nocturnal dyspnea or shortness of breath Dermatological: negative for rash Respiratory: negative for cough or wheezing Urologic: negative for hematuria Abdominal: negative for nausea, vomiting, diarrhea, bright red blood per rectum, melena, or hematemesis Neurologic: negative for visual changes, syncope, or dizziness All other systems reviewed and are otherwise negative except as noted above.    Blood pressure 136/84, pulse 67, height 5' (1.524 m), weight 230 lb 3.2 oz (104.4 kg), SpO2 96 %.  General appearance: alert and no  distress Neck: no adenopathy, no carotid bruit, no JVD, supple, symmetrical, trachea midline, and thyroid not enlarged, symmetric, no tenderness/mass/nodules Lungs: clear to auscultation bilaterally Heart: regular rate and rhythm, S1, S2 normal, no murmur, click, rub or gallop Extremities: extremities normal, atraumatic, no cyanosis or edema Pulses: 2+ and symmetric Skin: Skin color, texture, turgor normal. No rashes or lesions Neurologic: Grossly normal  EKG sinus rhythm at 67 without ST or T wave changes.  Personally reviewed this EKG.  ASSESSMENT AND PLAN:   HTN (hypertension) History of send hypertension a blood pressure measured today at 136/84.  She is on hydrochlorothiazide, lisinopril and metoprolol.  Dyslipidemia History of dyslipidemia on statin therapy followed by her PCP  Elevated coronary artery calcium score Coronary Score performed 04/21/2022 was 5, essentially no evidence of coronary artery disease.     Lorretta Harp MD FACP,FACC,FAHA, [facility] 10/01/2022 1:55 PM

## 2022-10-26 ENCOUNTER — Other Ambulatory Visit: Payer: Self-pay | Admitting: Cardiovascular Disease

## 2023-05-05 ENCOUNTER — Ambulatory Visit (INDEPENDENT_AMBULATORY_CARE_PROVIDER_SITE_OTHER): Payer: Self-pay | Admitting: Family

## 2023-05-05 ENCOUNTER — Encounter: Payer: Self-pay | Admitting: Family

## 2023-05-05 ENCOUNTER — Other Ambulatory Visit: Payer: Self-pay | Admitting: Radiology

## 2023-05-05 VITALS — BP 126/90 | HR 66 | Temp 97.3°F | Ht 62.0 in | Wt 230.4 lb

## 2023-05-05 DIAGNOSIS — E039 Hypothyroidism, unspecified: Secondary | ICD-10-CM

## 2023-05-05 DIAGNOSIS — K219 Gastro-esophageal reflux disease without esophagitis: Secondary | ICD-10-CM

## 2023-05-05 DIAGNOSIS — E042 Nontoxic multinodular goiter: Secondary | ICD-10-CM | POA: Insufficient documentation

## 2023-05-05 DIAGNOSIS — L409 Psoriasis, unspecified: Secondary | ICD-10-CM | POA: Insufficient documentation

## 2023-05-05 DIAGNOSIS — M2550 Pain in unspecified joint: Secondary | ICD-10-CM

## 2023-05-05 DIAGNOSIS — R0989 Other specified symptoms and signs involving the circulatory and respiratory systems: Secondary | ICD-10-CM

## 2023-05-05 DIAGNOSIS — E66813 Obesity, class 3: Secondary | ICD-10-CM | POA: Insufficient documentation

## 2023-05-05 DIAGNOSIS — I10 Essential (primary) hypertension: Secondary | ICD-10-CM

## 2023-05-05 DIAGNOSIS — E063 Autoimmune thyroiditis: Secondary | ICD-10-CM | POA: Insufficient documentation

## 2023-05-05 MED ORDER — CLOBETASOL PROPIONATE 0.05 % EX OINT: TOPICAL_OINTMENT | Freq: Two times a day (BID) | CUTANEOUS | 2 refills | Status: AC

## 2023-05-05 MED ORDER — NP THYROID 60 MG PO TABS: 60.0000 mg | ORAL_TABLET | Freq: Every morning | ORAL | 3 refills | Status: DC

## 2023-05-05 MED ORDER — CLOBETASOL PROPIONATE 0.05 % EX SOLN: 1.0000 | Freq: Two times a day (BID) | CUTANEOUS | 0 refills | Status: AC

## 2023-05-05 MED ORDER — CLOBETASOL PROPIONATE 0.05 % EX SOLN: 1.0000 | Freq: Two times a day (BID) | CUTANEOUS | 0 refills | Status: DC

## 2023-05-05 NOTE — Progress Notes (Signed)
New Patient Office Visit  Subjective:  Patient ID: Shelia Clements, female    DOB: [birth year]  Age: 57 y.o. MRN: [id number]  CC:  Chief Complaint  Patient presents with   Establish Care    HPI Shelia Clements is here to establish care as a new patient.  Oriented to practice routines and expectations.  Prior provider was:   Pt is with acute concerns.  Has joint pains and stiffness in the am often. Lower back, bil knees (has had injections for this in the past) She also reports fleeting rash, usually on her upper thighs at times.   Pt also states that when she eats bread or pasta she has trouble swallowing and at times feel she can not breathe. She notices this is the biggest trigger. She does not feel she has something caught in her throat.   Colonoscopy: has never had  Tetanus: 2023 Shingles: not sure Mammogram: 10/15/20   chronic concerns:  Hypothyroid: has been on synthroid however states goiter worsened, tried armour thyroid but had palpitations, has since started NP thyroid and doing well and tolerating well, and goiter has not increased in size. She was seeing endo , last visit one month ago, he has since retired so she comes with records along with her today. Last TSH per her paper records 03/03/23 TSH 1.5.   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   HLD: simvastatin 40 mg once daily tolerating well.   Omeprazole, gerd.   HTN: hydrochlorothiazide and metoprolol as well as lisinopril daily. Also with palpitations controlled by metoprolol.       ROS: Negative unless specifically indicated above in HPI.   Current Outpatient Medications:    diclofenac Sodium (VOLTAREN) 1 % GEL, SMARTSIG:2 Topical 1-4 Times Daily, Disp: , Rfl:    Docusate Calcium (STOOL SOFTENER PO), Take 1 capsule by mouth daily., Disp: , Rfl:    hydrochlorothiazide (MICROZIDE) 12.5 MG capsule, TAKE 1 CAPSULE BY MOUTH ONCE DAILY AS NEEDED, Disp: 90  capsule, Rfl: 0   iron polysaccharides (NIFEREX) 150 MG capsule, Take 1 capsule (150 mg total) by mouth daily., Disp: 90 capsule, Rfl: 2   lisinopril (ZESTRIL) 40 MG tablet, Take 1 tablet (40 mg total) by mouth daily., Disp: 90 tablet, Rfl: 3   loratadine (CLARITIN) 10 MG tablet, Take 10 mg by mouth daily., Disp: , Rfl:    metoprolol tartrate (LOPRESSOR) 25 MG tablet, TAKE 1 & 1/2 (ONE & ONE-HALF) TABLETS BY MOUTH TWICE DAILY, Disp: 270 tablet, Rfl: 3   omeprazole (PRILOSEC) 40 MG capsule, Take 1 capsule (40 mg total) by mouth daily., Disp: 90 capsule, Rfl: 3   simvastatin (ZOCOR) 40 MG tablet, Take 1 tablet (40 mg total) by mouth at bedtime., Disp: 90 tablet, Rfl: 3   clobetasol (TEMOVATE) 0.05 % external solution, Apply 1 Application topically 2 (two) times daily., Disp: 50 mL, Rfl: 0   clobetasol ointment (TEMOVATE) 0.05 %, Apply topically 2 (two) times daily., Disp: 30 g, Rfl: 2   NP THYROID 60 MG tablet, Take 1 tablet (60 mg total) by mouth every morning., Disp: 90 tablet, Rfl: 3 Past Medical History:  Diagnosis Date   Carotid bruit 10/2011   History of carotid bruits in the past with negative dopplers   Degenerative joint disease    Dyslipidemia    History of stress test 2006   Low risk study   Hx of echocardiogram 2005   normal study   Hypertension  Iron deficiency anemia 2010   Never had endo/colon (Dr Shelia Clements)   Noncompliance with medications    secondary to cost   Obesity 2005   negative sleep study    Palpitation    Past Surgical History:  Procedure Laterality Date   WISDOM TOOTH EXTRACTION      Objective:   Today's Vitals: BP (!) 126/90 (BP Location: Right Arm, Patient Position: Sitting, Cuff Size: Large)   Pulse 66   Temp (!) 97.3 F (36.3 C) (Temporal)   Ht 5\' 2"  (1.575 m)   Wt 230 lb 6.4 oz (104.5 kg)   SpO2 100%   BMI 42.14 kg/m   Physical Exam Constitutional:      General: She is not in acute distress.    Appearance: Normal appearance. She is obese.  She is not ill-appearing.  HENT:     Head: Normocephalic.     Right Ear: Tympanic membrane normal.     Left Ear: Tympanic membrane normal.     Nose: Nose normal.     Mouth/Throat:     Mouth: Mucous membranes are moist.  Eyes:     Extraocular Movements: Extraocular movements intact.     Pupils: Pupils are equal, round, and reactive to light.  Neck:     Thyroid: Thyromegaly present. No thyroid mass or thyroid tenderness.  Cardiovascular:     Rate and Rhythm: Normal rate and regular rhythm.  Pulmonary:     Effort: Pulmonary effort is normal.     Breath sounds: Normal breath sounds.  Abdominal:     General: Abdomen is flat. Bowel sounds are normal.     Palpations: Abdomen is soft.     Tenderness: There is no guarding or rebound.  Musculoskeletal:        General: Normal range of motion.     Cervical back: Normal range of motion.  Skin:    General: Skin is warm.     Capillary Refill: Capillary refill takes less than 2 seconds.  Neurological:     General: No focal deficit present.     Mental Status: She is alert.  Psychiatric:        Mood and Affect: Mood normal.        Behavior: Behavior normal.        Thought Content: Thought content normal.        Judgment: Judgment normal.     Assessment & Plan:  Scalp psoriasis Assessment & Plan: Trial clobetasol solution also advised to try out head and shoulder shampoo visits may help as well.  Can also try Selsun Blue and/or Nizoral or T-Gel.  Orders: -     Clobetasol Propionate; Apply 1 Application topically 2 (two) times daily.  Dispense: 50 mL; Refill: 0  Bilateral carotid bruits  Hashimoto thyroiditis  Nontoxic multinodular goiter  Class 3 severe obesity due to excess calories with serious comorbidity and body mass index (BMI) of 40.0 to 44.9 in adult [facility])  Primary hypothyroidism Assessment & Plan: Continue NP thyroid 60 mg once daily.  Orders: -     NP Thyroid; Take 1 tablet (60 mg total) by mouth every morning.   Dispense: 90 tablet; Refill: 3  Polyarthralgia -     ANA; Future -     Rheumatoid factor; Future  Psoriasis -     Clobetasol Propionate; Apply topically 2 (two) times daily.  Dispense: 30 g; Refill: 2  Primary hypertension Assessment & Plan: Slight elevation in office suspect whitecoat syndrome.  Advised patient to continue  to monitor at home and report any levels greater than 140/90.  Continue medications as prescribed to include hydrochlorothiazide and metoprolol and lisinopril.   Gastroesophageal reflux disease without esophagitis Assessment & Plan: Continue omeprazole discussed long-term PPI risks involved we will always continue to titrate down as able. Try to decrease and or avoid spicy foods, fried fatty foods, and also caffeine and chocolate as these can increase heartburn symptoms.       Follow-up: Return in about 6 months (around 11/03/2023) for f/u thyroid.   Mort Sawyers, FNP

## 2023-05-12 NOTE — Assessment & Plan Note (Signed)
Slight elevation in office suspect whitecoat syndrome.  Advised patient to continue to monitor at home and report any levels greater than 140/90.  Continue medications as prescribed to include hydrochlorothiazide and metoprolol and lisinopril.

## 2023-05-12 NOTE — Assessment & Plan Note (Signed)
Continue omeprazole discussed long-term PPI risks involved we will always continue to titrate down as able. Try to decrease and or avoid spicy foods, fried fatty foods, and also caffeine and chocolate as these can increase heartburn symptoms.

## 2023-05-12 NOTE — Assessment & Plan Note (Signed)
Continue NP thyroid 60 mg once daily.

## 2023-05-12 NOTE — Assessment & Plan Note (Signed)
Trial clobetasol solution also advised to try out head and shoulder shampoo visits may help as well.  Can also try Selsun Blue and/or Nizoral or T-Gel.

## 2023-05-21 ENCOUNTER — Other Ambulatory Visit: Payer: Self-pay | Admitting: Family

## 2023-05-21 ENCOUNTER — Telehealth: Payer: Self-pay | Admitting: Family

## 2023-05-21 DIAGNOSIS — M2550 Pain in unspecified joint: Secondary | ICD-10-CM

## 2023-05-21 NOTE — Telephone Encounter (Signed)
Mort Sawyers, FNP  P Dugal Pool Something occurred in transit with two of her labs testing for autoimmune disease and rheumatoid arthritis. These tests would be helpful if she would like a lab only to get these repeat drawn. ------------------ Attempted to contact pt, there was no answer and no option to leave a message. Will try back.

## 2023-05-22 NOTE — Telephone Encounter (Signed)
Attempted to contact pt, there was no answer and no option to leave a message. Will try back.

## 2023-05-25 NOTE — Telephone Encounter (Signed)
No answer no voicemail. Sending my chart message with information.

## 2023-09-25 ENCOUNTER — Other Ambulatory Visit: Payer: Self-pay | Admitting: Family

## 2023-09-25 DIAGNOSIS — E039 Hypothyroidism, unspecified: Secondary | ICD-10-CM

## 2023-09-25 NOTE — Telephone Encounter (Signed)
Copied from CRM [id number]. Topic: Clinical - Medication Refill >> Sep 25, 2023 10:41 AM [place] J wrote: Most Recent Primary Care Visit:  Provider: Mort Sawyers  Department: [facility]  Visit Type: NEW PATIENT  Date: 05/05/2023  Medication: NP THYROID 60 MG tablet  Has the patient contacted their pharmacy? Yes (Agent: If no, request that the patient contact the pharmacy for the refill. If patient does not wish to contact the pharmacy document the reason why and proceed with request.) (Agent: If yes, when and what did the pharmacy advise?)  Is this the correct pharmacy for this prescription? Yes If no, delete pharmacy and type the correct one.  This is the patient's preferred pharmacy:  [facility] [address]), [place] - [address] [place]) [place] [address] Phone: [phone] Fax: [phone]   Has the prescription been filled recently? No  Is the patient out of the medication? No  Has the patient been seen for an appointment in the last year OR does the patient have an upcoming appointment? Yes  Can we respond through MyChart? Yes  Agent: Please be advised that Rx refills may take up to 3 business days. We ask that you follow-up with your pharmacy.

## 2023-09-28 MED ORDER — NP THYROID 60 MG PO TABS: 60.0000 mg | ORAL_TABLET | Freq: Every morning | ORAL | 1 refills | Status: DC

## 2023-10-09 ENCOUNTER — Telehealth: Payer: Self-pay | Admitting: Family

## 2023-10-09 NOTE — Telephone Encounter (Signed)
 Per our records, pt is due for her mammogram.  Attempted to contact pt, there was no answer and no option to leave a message.

## 2023-10-19 ENCOUNTER — Ambulatory Visit: Payer: Self-pay | Admitting: Physician Assistant

## 2023-10-28 ENCOUNTER — Ambulatory Visit: Payer: Self-pay | Attending: Physician Assistant | Admitting: Physician Assistant

## 2023-10-28 VITALS — BP 180/86 | HR 67 | Ht 62.0 in | Wt 238.0 lb

## 2023-10-28 DIAGNOSIS — E785 Hyperlipidemia, unspecified: Secondary | ICD-10-CM

## 2023-10-28 DIAGNOSIS — R931 Abnormal findings on diagnostic imaging of heart and coronary circulation: Secondary | ICD-10-CM

## 2023-10-28 DIAGNOSIS — R002 Palpitations: Secondary | ICD-10-CM

## 2023-10-28 DIAGNOSIS — I10 Essential (primary) hypertension: Secondary | ICD-10-CM

## 2023-10-28 MED ORDER — LISINOPRIL 40 MG PO TABS: 40.0000 mg | ORAL_TABLET | Freq: Every day | ORAL | 3 refills | Status: AC

## 2023-10-28 MED ORDER — HYDROCHLOROTHIAZIDE 12.5 MG PO CAPS: 12.5000 mg | ORAL_CAPSULE | Freq: Every day | ORAL | 3 refills | Status: AC | PRN

## 2023-10-28 MED ORDER — SIMVASTATIN 40 MG PO TABS: 40.0000 mg | ORAL_TABLET | Freq: Every day | ORAL | 3 refills | Status: AC

## 2023-10-28 MED ORDER — METOPROLOL TARTRATE 25 MG PO TABS
ORAL_TABLET | ORAL | 3 refills | Status: AC
Start: 2023-10-28 — End: ?

## 2023-10-28 NOTE — Patient Instructions (Signed)
 Medication Instructions:  TAKE METOPROLOL TARTRATE 1.5 TABLETS TWICE A DAY *If you need a refill on your cardiac medications before your next appointment, please call your pharmacy*   Lab Work: NO LABS If you have labs (blood work) drawn today and your tests are completely normal, you will receive your results only by: MyChart Message (if you have MyChart) OR A paper copy in the mail If you have any lab test that is abnormal or we need to change your treatment, we will call you to review the results.   Testing/Procedures: NO TESTING   Follow-Up: At [facility], you and your health needs are our priority.  As part of our continuing mission to provide you with exceptional heart care, we have created designated Provider Care Teams.  These Care Teams include your primary Cardiologist (physician) and Advanced Practice Providers (APPs -  Physician Assistants and Nurse Practitioners) who all work together to provide you with the care you need, when you need it.  Your next appointment:   1 year(s)  Provider:   Nanetta Batty, MD   Other Instructions NEEDS A APPOINTMENT WITH PHARMACY [facility] IN 1 MONTH.

## 2023-10-28 NOTE — Progress Notes (Unsigned)
 Cardiology Office Note:  .   Date:  10/30/2023  ID:  Edman Circle, DOB November 18, 1965, MRN [id number] PCP: Mort Sawyers, FNP  [facility] Providers Cardiologist:  Nanetta Batty, MD     History of Present Illness: .   Shelia Clements is a 58 y.o. female with a hx of palpitation on low-dose beta-blocker, hypertension, hyperlipidemia, prediabetes and a family history of heart disease.  She had a negative Myoview in 2006 and normal echocardiogram 2005.  Repeat echocardiogram in 2023 showed EF 60 to 65%, no regional wall motion abnormality, grade 1 DD, no significant valve issue.  A coronary calcium score came back at 5 which placed the patient in the 80th percentile for age and sex matched control.  Given lack of chest pain, no further ischemic workup was done.  Patient was last seen by Dr. Allyson Sabal in February 2024 at which time she was doing well.  Patient presents today for follow-up.  She denies any chest pain or shortness of breath.  Last year, she did have some chest tightness, this occurs after she eats a large meal at [employer].  She was told her symptom might be related to hiatal hernia.  However since she began to cut her meal into smaller portion, symptom resolved and has not came back since.  The initial symptom was not related to exertion.  EKG is normal.  Blood pressure is quite elevated today.  However patient had the flu 2 weeks ago and is still recovering.  She did have increased palpitation which she describes as skipped heartbeat during the flu.  She has been taking 25 mg twice a day of metoprolol tartrate, I recommend she increase the metoprolol to 37.5 mg twice a day.  She should follow-up with our hypertension clinic in 1 month for reassessment of the blood pressure to see if her blood pressure improve after fluid symptom resolved.  Otherwise, she can follow-up with Dr. Allyson Sabal in 1 year.  ROS:   She denies chest pain, palpitations, dyspnea, pnd, orthopnea, n, v,  dizziness, syncope, edema, weight gain, or early satiety. All other systems reviewed and are otherwise negative except as noted above.    Studies Reviewed: Marland Kitchen   EKG Interpretation Date/Time:  Wednesday October 28 2023 14:32:05 EDT Ventricular Rate:  65 PR Interval:  168 QRS Duration:  92 QT Interval:  402 QTC Calculation: 418 R Axis:   70  Text Interpretation: Normal sinus rhythm No significant ST-T wave changes Confirmed by Azalee Course [id number]) on 10/28/2023 2:34:21 PM    Cardiac Studies & Procedures   ______________________________________________________________________________________________   STRESS TESTS  NM MYOCAR MULTI W/SPECT W 08/28/2004   ECHOCARDIOGRAM  ECHOCARDIOGRAM COMPLETE 04/18/2022  Narrative ECHOCARDIOGRAM REPORT    Patient Name:   Shelia Clements Date of Exam: 04/18/2022 Medical Rec #:  [id number]            Height:       62.0 in Accession #:    [id number]           Weight:       236.2 lb Date of Birth:  Jan 29, 1966           BSA:          2.052 m Patient Age:    55 years             BP:           160/80 mmHg Patient Gender: F  HR:           76 bpm. Exam Location:  [place]  Procedure: 2D Echo, Cardiac Doppler and Color Doppler  Indications:    R06.9 DOE; R60.0 Lower extremity edema  History:        Patient has prior history of Echocardiogram examinations, most recent 10/03/2003. Signs/Symptoms:Dyspnea and Edema; Risk Factors:Family History of Coronary Artery Disease, Hypertension, Dyslipidemia, Non-Smoker and GERD. Patient denies chest pain. She does have DOE with bilateral ankle edema.  Sonographer:    Carlos American RVT, RDCS (AE), RDMS Referring Phys: [phone] Mylan Lengyel   Sonographer Comments: Suboptimal parasternal window, suboptimal apical window, suboptimal subcostal window and patient is obese. Patient declined IV and Definity use. IMPRESSIONS   1. Left ventricular ejection fraction, by estimation, is 60 to 65%. The left  ventricle has normal function. The left ventricle has no regional wall motion abnormalities. Left ventricular diastolic parameters are consistent with Grade I diastolic dysfunction (impaired relaxation). 2. Right ventricular systolic function is normal. The right ventricular size is normal. Tricuspid regurgitation signal is inadequate for assessing PA pressure. 3. Left atrial size was mildly dilated. 4. The mitral valve is normal in structure. No evidence of mitral valve regurgitation. No evidence of mitral stenosis. 5. The aortic valve is normal in structure. Aortic valve regurgitation is not visualized. No aortic stenosis is present. 6. The inferior vena cava is normal in size with greater than 50% respiratory variability, suggesting right atrial pressure of 3 mmHg.  Comparison(s): EF 60%.  FINDINGS Left Ventricle: Left ventricular ejection fraction, by estimation, is 60 to 65%. The left ventricle has normal function. The left ventricle has no regional wall motion abnormalities. The left ventricular internal cavity size was normal in size. There is no left ventricular hypertrophy. Left ventricular diastolic parameters are consistent with Grade I diastolic dysfunction (impaired relaxation).  Right Ventricle: The right ventricular size is normal. No increase in right ventricular wall thickness. Right ventricular systolic function is normal. Tricuspid regurgitation signal is inadequate for assessing PA pressure.  Left Atrium: Left atrial size was mildly dilated.  Right Atrium: Right atrial size was normal in size.  Pericardium: There is no evidence of pericardial effusion.  Mitral Valve: The mitral valve is normal in structure. No evidence of mitral valve regurgitation. No evidence of mitral valve stenosis.  Tricuspid Valve: The tricuspid valve is normal in structure. Tricuspid valve regurgitation is mild . No evidence of tricuspid stenosis.  Aortic Valve: The aortic valve is normal in  structure. Aortic valve regurgitation is not visualized. No aortic stenosis is present. Aortic valve mean gradient measures 3.0 mmHg. Aortic valve peak gradient measures 7.7 mmHg. Aortic valve area, by VTI measures 2.91 cm.  Pulmonic Valve: The pulmonic valve was normal in structure. Pulmonic valve regurgitation is not visualized. No evidence of pulmonic stenosis.  Aorta: The aortic root is normal in size and structure.  Venous: The inferior vena cava is normal in size with greater than 50% respiratory variability, suggesting right atrial pressure of 3 mmHg.  IAS/Shunts: No atrial level shunt detected by color flow Doppler.   LEFT VENTRICLE PLAX 2D LVIDd:         5.00 cm   Diastology LVIDs:         2.60 cm   LV e' medial:    6.64 cm/s LV PW:         1.20 cm   LV E/e' medial:  15.4 LV IVS:        0.80 cm  LV e' lateral:   6.09 cm/s LVOT diam:     2.10 cm   LV E/e' lateral: 16.7 LV SV:         71 LV SV Index:   34 LVOT Area:     3.46 cm   RIGHT VENTRICLE RV S prime:     25.20 cm/s  LEFT ATRIUM             Index        RIGHT ATRIUM           Index LA diam:        4.30 cm 2.10 cm/m   RA Area:     16.40 cm LA Vol (A2C):   39.9 ml 19.45 ml/m  RA Volume:   41.90 ml  20.42 ml/m LA Vol (A4C):   70.0 ml 34.12 ml/m LA Biplane Vol: 56.3 ml 27.44 ml/m AORTIC VALVE                    PULMONIC VALVE AV Area (Vmax):    2.46 cm     PV Vmax:       0.97 m/s AV Area (Vmean):   2.95 cm     PV Peak grad:  3.8 mmHg AV Area (VTI):     2.91 cm AV Vmax:           139.00 cm/s AV Vmean:          82.300 cm/s AV VTI:            0.243 m AV Peak Grad:      7.7 mmHg AV Mean Grad:      3.0 mmHg LVOT Vmax:         98.90 cm/s LVOT Vmean:        70.000 cm/s LVOT VTI:          0.204 m LVOT/AV VTI ratio: 0.84  AORTA Ao Root diam: 3.00 cm Ao Asc diam:  2.90 cm Ao Arch diam: 2.9 cm  MITRAL VALVE MV Area (PHT): 3.15 cm     SHUNTS MV Decel Time: 241 msec     Systemic VTI:  0.20 m MV E  velocity: 102.00 cm/s  Systemic Diam: 2.10 cm MV A velocity: 104.00 cm/s MV E/A ratio:  0.98  Julien Nordmann MD Electronically signed by Julien Nordmann MD Signature Date/Time: 04/18/2022/4:03:29 PM    Final      CT SCANS  CT CARDIAC SCORING (SELF PAY ONLY) 04/21/2022  Addendum 04/22/2022  4:25 PM ADDENDUM REPORT: 04/22/2022 16:23  CLINICAL DATA:  Cardiovascular Disease Risk stratification  EXAM: Coronary Calcium Score  TECHNIQUE: A gated, non-contrast computed tomography scan of the heart was performed using 3mm slice thickness. Axial images were analyzed on a dedicated workstation. Calcium scoring of the coronary arteries was performed using the Agatston method.  FINDINGS: Coronary arteries: Normal origins.  Coronary Calcium Score:  Left main:  Left anterior descending artery: 2  Left circumflex artery: 1  Right coronary artery: 2  Total: 5  Percentile: 80  Pericardium: Normal.  Ascending Aorta: Normal caliber.  Non-cardiac: See separate report from [facility].  IMPRESSION: Coronary calcium score of 5. This was 80th percentile for age-, race-, and sex-matched controls.  RECOMMENDATIONS: Coronary artery calcium (CAC) score is a strong predictor of incident coronary heart disease (CHD) and provides predictive information beyond traditional risk factors. CAC scoring is reasonable to use in the decision to withhold, postpone, or initiate statin therapy in intermediate-risk or selected borderline-risk asymptomatic  adults (age 32-75 years and LDL-C >=70 to <190 mg/dL) who do not have diabetes or established atherosclerotic cardiovascular disease (ASCVD).* In intermediate-risk (10-year ASCVD risk >=7.5% to <20%) adults or selected borderline-risk (10-year ASCVD risk >=5% to <7.5%) adults in whom a CAC score is measured for the purpose of making a treatment decision the following recommendations have been made:  If CAC=0, it is reasonable to  withhold statin therapy and reassess in 5 to 10 years, as long as higher risk conditions are absent (diabetes mellitus, family history of premature CHD in first degree relatives (males <55 years; females <65 years), cigarette smoking, or LDL >=190 mg/dL).  If CAC is 1 to 99, it is reasonable to initiate statin therapy for patients >=96 years of age.  If CAC is >=100 or >=75th percentile, it is reasonable to initiate statin therapy at any age.  Cardiology referral should be considered for patients with CAC scores >=400 or >=75th percentile.  *2018 AHA/ACC/AACVPR/AAPA/ABC/ACPM/ADA/AGS/APhA/ASPC/NLA/PCNA Guideline on the Management of Blood Cholesterol: A Report of the American College of Cardiology/American Heart Association Task Force on Clinical Practice Guidelines. J Am Coll Cardiol. 2019;73(24):3168-3209.  Epifanio Lesches, MD   Electronically Signed By: Epifanio Lesches M.D. On: 04/22/2022 16:23  Narrative CLINICAL DATA:  This over-read does not include interpretation of cardiac or coronary anatomy or pathology. The coronary calcium score interpretation by the cardiologist is attached.  COMPARISON:  None Available.  FINDINGS: Vascular: There are no significant non-cardiac vascular findings.  Mediastinum/Nodes: There are no enlarged lymph nodes.The visualized esophagus demonstrates no significant findings.  Lungs/Pleura: Clear lungs. No pneumothorax or pleural effusion.  Upper abdomen: No acute abnormality.  Musculoskeletal/Chest wall: No chest wall abnormality. No acute or significant osseous findings. Exaggerated thoracic kyphosis.  IMPRESSION: 1. No significant extracardiac findings.  Electronically Signed: By: Obie Dredge M.D. On: 04/22/2022 11:50     ______________________________________________________________________________________________      Risk Assessment/Calculations:            Physical Exam:   VS:  BP (!) 180/86 (BP  Location: Right Arm, Patient Position: Sitting, Cuff Size: Large)   Pulse 67   Ht 5\' 2"  (1.575 m)   Wt 238 lb (108 kg)   SpO2 98%   BMI 43.53 kg/m    Wt Readings from Last 3 Encounters:  10/28/23 238 lb (108 kg)  05/05/23 230 lb 6.4 oz (104.5 kg)  10/01/22 230 lb 3.2 oz (104.4 kg)    GEN: Well nourished, well developed in no acute distress NECK: No JVD; No carotid bruits CARDIAC: RRR, no murmurs, rubs, gallops RESPIRATORY:  Clear to auscultation without rales, wheezing or rhonchi  ABDOMEN: Soft, non-tender, non-distended EXTREMITIES:  No edema; No deformity   ASSESSMENT AND PLAN: .    Palpitations Palpitations likely PVCs, increased during illness. Managed with metoprolol tartrate. No chest pain or exertion association. - Increase metoprolol tartrate to 37.5 mg twice a day.  Hypertension Blood pressure elevated, likely due to recent influenza. Previous readings normal. Plan to increase metoprolol tartrate for better management. - Increase metoprolol tartrate to 37.5 mg twice a day. - Refer to hypertension clinic for reassessment in one month.  Hyperlipidemia -On simvastatin         Dispo: Follow-up with Dr. Allyson Sabal in 1 year, will refer to hypertension clinic during the meantime for blood pressure management.  Signed, Azalee Course, PA

## 2023-11-03 ENCOUNTER — Ambulatory Visit: Payer: Self-pay | Admitting: Family

## 2023-11-04 ENCOUNTER — Telehealth: Payer: Self-pay | Admitting: Family

## 2023-11-04 ENCOUNTER — Ambulatory Visit (INDEPENDENT_AMBULATORY_CARE_PROVIDER_SITE_OTHER): Payer: Self-pay | Admitting: Family

## 2023-11-04 ENCOUNTER — Encounter: Payer: Self-pay | Admitting: Family

## 2023-11-04 VITALS — BP 144/90 | HR 65 | Temp 98.2°F | Ht 62.0 in | Wt 239.0 lb

## 2023-11-04 DIAGNOSIS — E785 Hyperlipidemia, unspecified: Secondary | ICD-10-CM

## 2023-11-04 DIAGNOSIS — E042 Nontoxic multinodular goiter: Secondary | ICD-10-CM

## 2023-11-04 DIAGNOSIS — M2550 Pain in unspecified joint: Secondary | ICD-10-CM

## 2023-11-04 DIAGNOSIS — E039 Hypothyroidism, unspecified: Secondary | ICD-10-CM

## 2023-11-04 DIAGNOSIS — E063 Autoimmune thyroiditis: Secondary | ICD-10-CM

## 2023-11-04 DIAGNOSIS — Z6841 Body Mass Index (BMI) 40.0 and over, adult: Secondary | ICD-10-CM

## 2023-11-04 DIAGNOSIS — I10 Essential (primary) hypertension: Secondary | ICD-10-CM

## 2023-11-04 DIAGNOSIS — E66813 Obesity, class 3: Secondary | ICD-10-CM

## 2023-11-04 DIAGNOSIS — J0110 Acute frontal sinusitis, unspecified: Secondary | ICD-10-CM

## 2023-11-04 MED ORDER — DOXYCYCLINE HYCLATE 100 MG PO TABS: 100.0000 mg | ORAL_TABLET | Freq: Two times a day (BID) | ORAL | 0 refills | Status: AC

## 2023-11-04 NOTE — Patient Instructions (Addendum)
  I have ordered imaging for you at [facility] outpatient diagnostic center for a thyroid ultrasound. This order has been sent over for you electronically.  Please call [phone] to schedule this appointment.  ------------------------------------  Labs I would like for you to get:   TSH  A1C Cmp  Lipid panel  ------------------------------------  [place] [facility]

## 2023-11-04 NOTE — Telephone Encounter (Signed)
 Can you please release ANA reflex and RF to [facility] for pt please

## 2023-11-04 NOTE — Assessment & Plan Note (Signed)
 Pt advised to work on diet and exercise as tolerated

## 2023-11-04 NOTE — Assessment & Plan Note (Signed)
 Ordering tsh in the next few months.  Repeat u/s thyroid pending results.  May consider getting pt back with endocrinology

## 2023-11-04 NOTE — Assessment & Plan Note (Addendum)
 Slight elevation in office, continue f/u with cardiology.  Continue medications as prescribed to include hydrochlorothiazide and metoprolol and lisinopril.

## 2023-11-04 NOTE — Assessment & Plan Note (Signed)
 Pt to get ANA and RF testing at [facility] pending results.  R/o autoimmune disease

## 2023-11-04 NOTE — Assessment & Plan Note (Signed)
RX doxycycline 100 mg po bid x 10 days Pt to continue tylenol/ibuprofen prn sinus pain. Continue with humidifier prn and steam showers recommended as well. instructed If no symptom improvement in 48 hours please f/u  

## 2023-11-04 NOTE — Progress Notes (Signed)
 Established Patient Office Visit  Subjective:      CC:  Chief Complaint  Patient presents with   Medical Management of Chronic Issues    HPI: Shelia Clements is a 58 y.o. female presenting on 11/04/2023 for Medical Management of Chronic Issues . Was 'really sick' a few weeks ago, and had sinus and facial pain (nose, cheek and head) but most of this has resolved however still on the right side of her she has tenderness when touching it and her teeth still hurt along the right upper cheekbone. Sore throat has resolved. She is taking claritin daily.   Hypothyroid: paper records with pt show tsh 1.5 back in 03/03/23 She was seeing endo every six months with the endocrinologist, she had an ultrasound probably around 08/2022. She was advised to get u/s once a year . On np thyroid 60 mg once daily.   HTN: again elevated today 144/90  On lisinopril 40 mg once daily, metoprolol 12.5 mg twice daily as well as htz 12.5 mg. Was just seen in office with cardiologist and blood pressure was elevated and so they increased metoprolol tartrate 25 mg, 1 and 1/2 tablet twice daily. She has just started checking her blood pressure starting yesterday at home and yesterday was 140/80 , this am was 140/75  HLD: tolerating and doing well on simvastatin 40 mg nightly       Social history:  Relevant past medical, surgical, family and social history reviewed and updated as indicated. Interim medical history since our last visit reviewed.  Allergies and medications reviewed and updated.  DATA REVIEWED: CHART IN EPIC     ROS: Negative unless specifically indicated above in HPI.    Current Outpatient Medications:    clobetasol (TEMOVATE) 0.05 % external solution, Apply 1 Application topically 2 (two) times daily., Disp: 50 mL, Rfl: 0   clobetasol ointment (TEMOVATE) 0.05 %, Apply topically 2 (two) times daily., Disp: 30 g, Rfl: 2   diclofenac Sodium (VOLTAREN) 1 % GEL, SMARTSIG:2 Topical 1-4  Times Daily, Disp: , Rfl:    Docusate Calcium (STOOL SOFTENER PO), Take 1 capsule by mouth daily., Disp: , Rfl:    doxycycline (VIBRA-TABS) 100 MG tablet, Take 1 tablet (100 mg total) by mouth 2 (two) times daily for 10 days., Disp: 20 tablet, Rfl: 0   hydrochlorothiazide (MICROZIDE) 12.5 MG capsule, Take 1 capsule (12.5 mg total) by mouth daily as needed., Disp: 25 capsule, Rfl: 3   lisinopril (ZESTRIL) 40 MG tablet, Take 1 tablet (40 mg total) by mouth daily., Disp: 90 tablet, Rfl: 3   loratadine (CLARITIN) 10 MG tablet, Take 10 mg by mouth daily., Disp: , Rfl:    metoprolol tartrate (LOPRESSOR) 25 MG tablet, TAKE 1 & 1/2 (ONE & ONE-HALF) TABLETS BY MOUTH TWICE DAILY, Disp: 270 tablet, Rfl: 3   NP THYROID 60 MG tablet, Take 1 tablet (60 mg total) by mouth every morning., Disp: 90 tablet, Rfl: 1   omeprazole (PRILOSEC) 40 MG capsule, Take 1 capsule (40 mg total) by mouth daily., Disp: 90 capsule, Rfl: 3   simvastatin (ZOCOR) 40 MG tablet, Take 1 tablet (40 mg total) by mouth at bedtime., Disp: 90 tablet, Rfl: 3   iron polysaccharides (NIFEREX) 150 MG capsule, Take 1 capsule (150 mg total) by mouth daily. (Patient not taking: Reported on 11/04/2023), Disp: 90 capsule, Rfl: 2      Objective:    BP (!) 144/90 (BP Location: Left Arm, Patient Position: Sitting, Cuff Size: Large)  Pulse 65   Temp 98.2 F (36.8 C)   Ht 5\' 2"  (1.575 m)   Wt 239 lb (108.4 kg)   SpO2 96%   BMI 43.71 kg/m   Wt Readings from Last 3 Encounters:  11/04/23 239 lb (108.4 kg)  10/28/23 238 lb (108 kg)  05/05/23 230 lb 6.4 oz (104.5 kg)    Physical Exam Constitutional:      General: She is not in acute distress.    Appearance: Normal appearance. She is obese. She is not ill-appearing, toxic-appearing or diaphoretic.  HENT:     Head: Normocephalic.     Nose:     Right Sinus: Maxillary sinus tenderness and frontal sinus tenderness present.     Left Sinus: Maxillary sinus tenderness and frontal sinus tenderness  present.  Neck:     Thyroid: Thyromegaly present. No thyroid tenderness.  Cardiovascular:     Rate and Rhythm: Normal rate.  Pulmonary:     Effort: Pulmonary effort is normal.  Musculoskeletal:        General: Normal range of motion.  Neurological:     General: No focal deficit present.     Mental Status: She is alert and oriented to person, place, and time. Mental status is at baseline.  Psychiatric:        Mood and Affect: Mood normal.        Behavior: Behavior normal.        Thought Content: Thought content normal.        Judgment: Judgment normal.           Assessment & Plan:  Acute non-recurrent frontal sinusitis Assessment & Plan: RX doxycycline 100 mg po bid x 10 days Pt to continue tylenol/ibuprofen prn sinus pain. Continue with humidifier prn and steam showers recommended as well. instructed If no symptom improvement in 48 hours please f/u   Orders: -     US THYROID; Future -     Doxycycline Hyclate; Take 1 tablet (100 mg total) by mouth 2 (two) times daily for 10 days.  Dispense: 20 tablet; Refill: 0  Hashimoto thyroiditis -     US THYROID; Future  Nontoxic multinodular goiter  Polyarthralgia Assessment & Plan: Pt to get ANA and RF testing at [facility] pending results.  R/o autoimmune disease   Orders: -     ANA w/Reflex; Future -     Rheumatoid factor; Future  Primary hypertension Assessment & Plan: Slight elevation in office, continue f/u with cardiology.  Continue medications as prescribed to include hydrochlorothiazide and metoprolol and lisinopril.   Hyperlipidemia, unspecified hyperlipidemia type Assessment & Plan: Continue atorvastatin 40 mg nightly.  Work on low cholesterol diet and exercise as tolerated    Class 3 severe obesity due to excess calories with serious comorbidity and body mass index (BMI) of 40.0 to 44.9 in adult [facility]) Assessment & Plan: Pt advised to work on diet and exercise as tolerated    Primary  hypothyroidism Assessment & Plan: Ordering tsh in the next few months.  Repeat u/s thyroid pending results.  May consider getting pt back with endocrinology      Return in about 6 months (around 05/05/2024) for f/u CPE.  Mort Sawyers, MSN, APRN, FNP-C [facility]

## 2023-11-04 NOTE — Assessment & Plan Note (Signed)
Continue atorvastatin 40 mg nightly  Work on low cholesterol diet and exercise as tolerated

## 2023-11-19 ENCOUNTER — Encounter: Payer: Self-pay | Admitting: *Deleted

## 2023-11-30 LAB — LAB REPORT - SCANNED: A1c: 7

## 2023-12-15 ENCOUNTER — Encounter: Payer: Self-pay | Admitting: Family

## 2023-12-22 ENCOUNTER — Encounter (INDEPENDENT_AMBULATORY_CARE_PROVIDER_SITE_OTHER): Payer: Self-pay

## 2023-12-31 ENCOUNTER — Ambulatory Visit: Payer: Self-pay | Admitting: Pharmacist Clinician (PhC)/ Clinical Pharmacy Specialist

## 2024-01-05 ENCOUNTER — Ambulatory Visit: Payer: Self-pay

## 2024-01-21 ENCOUNTER — Other Ambulatory Visit (INDEPENDENT_AMBULATORY_CARE_PROVIDER_SITE_OTHER): Payer: Self-pay | Admitting: Vascular Surgery

## 2024-01-21 DIAGNOSIS — I8310 Varicose veins of unspecified lower extremity with inflammation: Secondary | ICD-10-CM

## 2024-01-23 DIAGNOSIS — I8310 Varicose veins of unspecified lower extremity with inflammation: Secondary | ICD-10-CM | POA: Insufficient documentation

## 2024-01-23 DIAGNOSIS — I2510 Atherosclerotic heart disease of native coronary artery without angina pectoris: Secondary | ICD-10-CM | POA: Insufficient documentation

## 2024-01-23 NOTE — Progress Notes (Signed)
 MRN : [id number]  Shelia Clements is a 58 y.o. (1965-11-22) female who presents with chief complaint of varicose veins hurt.  History of Present Illness:   The patient is seen for evaluation of symptomatic varicose veins. The patient relates burning and stinging which worsened steadily throughout the course of the day, particularly with standing. The patient also notes an aching and throbbing pain over the varicosities, particularly with prolonged dependent positions. The symptoms are significantly improved with elevation.  The patient also notes that during hot weather the symptoms are greatly intensified. The patient states the pain from the varicose veins interferes with work, daily exercise, shopping and household maintenance. At this point, the symptoms are persistent and severe enough that they're having a negative impact on lifestyle and are interfering with daily activities.  There is no history of DVT, PE or superficial thrombophlebitis. There is no history of ulceration or hemorrhage. The patient denies a significant family history of varicose veins.  The patient has worn graduated compression frequently in the past.  She did not notice any symptom relief with compression.  At the present time the patient has also been using over-the-counter analgesics. There is a history of prior surgical intervention or sclerotherapy.  She notes a remote history of laser ablation of the saphenous vein.  Duplex ultrasound of the venous system dated 01/25/2024 demonstrates reflux throughout the great saphenous vein bilaterally.  On the right the saphenous vein measures up to millimeters in diameter9 this is also true of the left saphenous vein.   No outpatient medications have been marked as taking for the 01/25/24 encounter (Appointment) with Jama, Cordella MATSU, MD.    Past Medical History:  Diagnosis Date   Carotid bruit 10/2011   History of carotid bruits in the past with  negative dopplers   Degenerative joint disease    Dyslipidemia    History of stress test 2006   Low risk study   Hx of echocardiogram 2005   normal study   Hypertension    Iron  deficiency anemia 2010   Never had endo/colon (Dr Kristie)   Noncompliance with medications    secondary to cost   Obesity 2005   negative sleep study    Palpitation     Past Surgical History:  Procedure Laterality Date   WISDOM TOOTH EXTRACTION      Social History Social History   Tobacco Use   Smoking status: Never   Smokeless tobacco: Never  Vaping Use   Vaping status: Never Used  Substance Use Topics   Alcohol use: No   Drug use: No    Family History Family History  Problem Relation Age of Onset   CAD Mother        CABG and stenting x2   Hypertension Mother    Ovarian cancer Mother    Aortic stenosis Father    Heart attack Sister    Stroke Sister     Allergies  Allergen Reactions   Ferrous Sulfate Other (See Comments)    Issues due to sulfa allergy needs sulfa free Also with constipation , takes stool softeners to tolerate   Penicillins Other (See Comments)    Unknown reaction Has since taken amoxicillin since a child, and no issues unsure if true reaction    Sulfa Antibiotics Hives    Hives      REVIEW OF SYSTEMS (Negative unless checked)  Constitutional: [] Weight loss  [] Fever  []   Chills Cardiac: [] Chest pain   [] Chest pressure   [] Palpitations   [] Shortness of breath when laying flat   [] Shortness of breath with exertion. Vascular:  [] Pain in legs with walking   [x] Pain in legs with standing  [] History of DVT   [] Phlebitis   [] Swelling in legs   [x] Varicose veins   [] Non-healing ulcers Pulmonary:   [] Uses home oxygen   [] Productive cough   [] Hemoptysis   [] Wheeze  [] COPD   [] Asthma Neurologic:  [] Dizziness   [] Seizures   [] History of stroke   [] History of TIA  [] Aphasia   [] Vissual changes   [] Weakness or numbness in arm   [] Weakness or numbness in leg Musculoskeletal:    [] Joint swelling   [] Joint pain   [] Low back pain Hematologic:  [] Easy bruising  [] Easy bleeding   [] Hypercoagulable state   [] Anemic Gastrointestinal:  [] Diarrhea   [] Vomiting  [] Gastroesophageal reflux/heartburn   [] Difficulty swallowing. Genitourinary:  [] Chronic kidney disease   [] Difficult urination  [] Frequent urination   [] Blood in urine Skin:  [] Rashes   [] Ulcers  Psychological:  [] History of anxiety   []  History of major depression.  Physical Examination  There were no vitals filed for this visit. There is no height or weight on file to calculate BMI. Gen: WD/WN, NAD Head: [place]/AT, No temporalis wasting.  Ear/Nose/Throat: Hearing grossly intact, nares w/o erythema or drainage, pinna without lesions Eyes: PER, EOMI, sclera nonicteric.  Neck: Supple, no gross masses.  No JVD.  Pulmonary:  Good air movement, no audible wheezing, no use of accessory muscles.  Cardiac: RRR, precordium not hyperdynamic. Vascular:  Large varicosities present, greater than 10 mm bilaterally.  Veins are tender to palpation moderate venous stasis changes to the legs bilaterally.  Trace soft pitting edema CEAP C3sEpAsPr Vessel Right Left  Radial Palpable Palpable  Gastrointestinal: soft, non-distended. No guarding/no peritoneal signs.  Musculoskeletal: M/S 5/5 throughout.  No deformity.  Neurologic: CN 2-12 intact. Pain and light touch intact in extremities.  Symmetrical.  Speech is fluent. Motor exam as listed above. Psychiatric: Judgment intact, Mood & affect appropriate for pt's clinical situation. Dermatologic: Venous rashes no ulcers noted.  No changes consistent with cellulitis. Lymph : No lichenification or skin changes of chronic lymphedema.  CBC Lab Results  Component Value Date   WBC 8.2 09/11/2021   HGB 12.8 09/11/2021   HCT 42.5 09/11/2021   MCV 79.9 (L) 09/11/2021   PLT 275 09/11/2021    BMET    Component Value Date/Time   NA 139 09/11/2021 1342   NA 140 06/06/2013 0925   K 3.7  09/11/2021 1342   CL 107 09/11/2021 1342   CO2 25 09/11/2021 1342   GLUCOSE 119 (H) 09/11/2021 1342   BUN 20 09/11/2021 1342   BUN 13 06/06/2013 0925   CREATININE 0.72 09/11/2021 1342   CREATININE 0.57 12/27/2013 1135   CALCIUM 8.9 09/11/2021 1342   GFRNONAA >60 09/11/2021 1342   GFRAA 125 06/06/2013 0925   CrCl cannot be calculated (Patient's most recent lab result is older than the maximum 21 days allowed.).  COAG No results found for: INR, PROTIME  Radiology No results found.   Assessment/Plan 1. Varicose veins with inflammation (Primary) Recommend  I have reviewed my previous  discussion with the patient regarding  varicose veins and why they cause symptoms. Patient will continue  wearing graduated compression stockings class 1 on a daily basis, beginning first thing in the morning and removing them in the evening.  The patient is CEAP  C3sEpAsPr.  The patient has been wearing compression for more than 12 weeks with no or little benefit.  The patient has been exercising daily for more than 12 weeks. The patient has been elevating and taking OTC pain medications for more than 12 weeks.  None of these have have eliminated the pain related to the varicose veins and venous reflux or the discomfort regarding venous congestion.    In addition, behavioral modification including elevation during the day was again discussed and this will continue.  The patient has utilized over the counter pain medications and has been exercising.  However, at this time conservative therapy has not alleviated the patient's symptoms of leg pain and swelling  Recommend: laser ablation of the right and  left great saphenous veins to eliminate the symptoms of pain and swelling of the lower extremities caused by the severe superficial venous reflux disease.  The patient voices understanding.  She we will consider this and will call depending on her decision.  In the meantime I have stressed the  importance of graduated compression and given her information about [email or web address] as well as [employer]  2. Primary hypertension Continue antihypertensive medications as already ordered, these medications have been reviewed and there are no changes at this time.  3. Coronary artery disease involving native coronary artery of native heart with angina pectoris (HCC) Continue cardiac and antihypertensive medications as already ordered and reviewed, no changes at this time.  Continue statin as ordered and reviewed, no changes at this time  Nitrates PRN for chest pain  4. Dyslipidemia Continue statin as ordered and reviewed, no changes at this time d   Cordella Shawl, MD  01/23/2024 3:36 PM

## 2024-01-25 ENCOUNTER — Ambulatory Visit (INDEPENDENT_AMBULATORY_CARE_PROVIDER_SITE_OTHER): Payer: Self-pay | Admitting: Vascular Surgery

## 2024-01-25 ENCOUNTER — Ambulatory Visit (INDEPENDENT_AMBULATORY_CARE_PROVIDER_SITE_OTHER): Payer: Self-pay

## 2024-01-25 ENCOUNTER — Encounter (INDEPENDENT_AMBULATORY_CARE_PROVIDER_SITE_OTHER): Payer: Self-pay | Admitting: Vascular Surgery

## 2024-01-25 ENCOUNTER — Telehealth: Payer: Self-pay | Admitting: Family

## 2024-01-25 VITALS — BP 172/87 | HR 67 | Ht 62.0 in | Wt 240.2 lb

## 2024-01-25 DIAGNOSIS — I10 Essential (primary) hypertension: Secondary | ICD-10-CM

## 2024-01-25 DIAGNOSIS — I25119 Atherosclerotic heart disease of native coronary artery with unspecified angina pectoris: Secondary | ICD-10-CM

## 2024-01-25 DIAGNOSIS — I8310 Varicose veins of unspecified lower extremity with inflammation: Secondary | ICD-10-CM

## 2024-01-25 DIAGNOSIS — E785 Hyperlipidemia, unspecified: Secondary | ICD-10-CM

## 2024-01-25 NOTE — Telephone Encounter (Signed)
 Patient verbalized to give a copy document to Tabitha and wanted the nurse to know about the specific test highlighted on the document.

## 2024-01-26 NOTE — Telephone Encounter (Signed)
 Called to get more information not able to leave voicemail. Also need to see if patient requested Tabitha be removed as pcp?

## 2024-01-28 ENCOUNTER — Telehealth: Payer: Self-pay | Admitting: Family

## 2024-01-28 NOTE — Telephone Encounter (Signed)
 I received some recent lab results from [facility].  I was going to place a referral but she has a new PCP listed. Can we confirm that I am no longer her PCP?

## 2024-01-28 NOTE — Telephone Encounter (Signed)
 Yes please send message. Thanks!

## 2024-01-31 ENCOUNTER — Encounter (INDEPENDENT_AMBULATORY_CARE_PROVIDER_SITE_OTHER): Payer: Self-pay | Admitting: Vascular Surgery

## 2024-02-02 ENCOUNTER — Encounter: Payer: Self-pay | Admitting: Family

## 2024-02-02 ENCOUNTER — Other Ambulatory Visit: Payer: Self-pay

## 2024-02-02 DIAGNOSIS — R768 Other specified abnormal immunological findings in serum: Secondary | ICD-10-CM

## 2024-02-02 DIAGNOSIS — Z1231 Encounter for screening mammogram for malignant neoplasm of breast: Secondary | ICD-10-CM

## 2024-02-02 DIAGNOSIS — Z832 Family history of diseases of the blood and blood-forming organs and certain disorders involving the immune mechanism: Secondary | ICD-10-CM

## 2024-02-02 DIAGNOSIS — E063 Autoimmune thyroiditis: Secondary | ICD-10-CM

## 2024-02-02 DIAGNOSIS — M2550 Pain in unspecified joint: Secondary | ICD-10-CM

## 2024-02-08 NOTE — Telephone Encounter (Signed)
 See Mychart encounter. Patient sent message and Ginger has responded through mychart.

## 2024-02-09 ENCOUNTER — Telehealth: Payer: Self-pay | Admitting: Family

## 2024-02-09 NOTE — Telephone Encounter (Signed)
 Patient brought in ppwk it is in providers box

## 2024-02-09 NOTE — Telephone Encounter (Signed)
 Error

## 2024-02-10 NOTE — Telephone Encounter (Signed)
 It was in the box at the front desk, it has been moved to your inbox in your office

## 2024-02-10 NOTE — Telephone Encounter (Signed)
 Hey renae it is not in my inbox.  Are you sure you put it in the write inbox?

## 2024-02-16 NOTE — Addendum Note (Signed)
 Addended by: CORWIN ANTU on: 02/16/2024 09:08 AM   Modules accepted: Orders

## 2024-02-23 ENCOUNTER — Ambulatory Visit
Admission: RE | Admit: 2024-02-23 | Discharge: 2024-02-23 | Disposition: A | Discharge status: Home / Self Care | Payer: Self-pay | Source: Ambulatory Visit | Attending: Family | Admitting: Family

## 2024-02-23 DIAGNOSIS — E063 Autoimmune thyroiditis: Secondary | ICD-10-CM | POA: Insufficient documentation

## 2024-02-23 DIAGNOSIS — J0110 Acute frontal sinusitis, unspecified: Secondary | ICD-10-CM | POA: Insufficient documentation

## 2024-02-24 ENCOUNTER — Ambulatory Visit: Payer: Self-pay | Admitting: Family

## 2024-02-24 ENCOUNTER — Encounter: Payer: Self-pay | Admitting: Family

## 2024-02-24 DIAGNOSIS — E042 Nontoxic multinodular goiter: Secondary | ICD-10-CM

## 2024-02-24 DIAGNOSIS — E041 Nontoxic single thyroid nodule: Secondary | ICD-10-CM | POA: Insufficient documentation

## 2024-02-29 ENCOUNTER — Ambulatory Visit: Payer: Self-pay

## 2024-03-14 ENCOUNTER — Other Ambulatory Visit: Payer: Self-pay | Admitting: *Deleted

## 2024-03-14 DIAGNOSIS — E039 Hypothyroidism, unspecified: Secondary | ICD-10-CM

## 2024-03-14 MED ORDER — NP THYROID 60 MG PO TABS: 60.0000 mg | ORAL_TABLET | Freq: Every morning | ORAL | 1 refills | Status: AC

## 2024-04-11 ENCOUNTER — Ambulatory Visit: Payer: Self-pay

## 2024-05-09 ENCOUNTER — Encounter

## 2024-07-25 ENCOUNTER — Encounter

## 2024-07-25 NOTE — Progress Notes (Deleted)
 "  Office Visit Note  Patient: Shelia Clements             Date of Birth: Jan 30, 1966           MRN: [id number]             PCP: Corwin Antu, FNP Referring: Corwin Antu, FNP Visit Date: 07/25/2024 Occupation: Data Unavailable  Subjective:  No chief complaint on file.   History of Present Illness: Shelia Clements is a 58 y.o. female ***     Activities of Daily Living:  Patient reports morning stiffness for *** {minute/hour:19697}.   Patient {ACTIONS;DENIES/REPORTS:21021675::Denies} nocturnal pain.  Difficulty dressing/grooming: {ACTIONS;DENIES/REPORTS:21021675::Denies} Difficulty climbing stairs: {ACTIONS;DENIES/REPORTS:21021675::Denies} Difficulty getting out of chair: {ACTIONS;DENIES/REPORTS:21021675::Denies} Difficulty using hands for taps, buttons, cutlery, and/or writing: {ACTIONS;DENIES/REPORTS:21021675::Denies}  No Rheumatology ROS completed.   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   GERD (gastroesophageal reflux disease) 04/11/2014   H/O hyperglycemia 04/11/2014   Hyperlipidemia, unspecified 04/11/2014   Family history of diabetes mellitus in mother 12/27/2013   HTN (hypertension)    Palpitations    Dyslipidemia    History nuclear stress 2006    Hx of normal echocardiogram 2005    Morbid obesity (HCC)    Osteoarthrosis    Iron  deficiency  anemia    Personal history of noncompliance with medical treatment, presenting hazards to health    Carotid bruit- normal dopplers 2013 10/03/2011    Past Medical History:  Diagnosis Date   Carotid bruit 10/2011   History of carotid bruits in the past with negative dopplers   Degenerative joint disease    Dyslipidemia    History of stress test 2006   Low risk study   Hx of echocardiogram 2005   normal study   Hypertension    Iron  deficiency anemia 2010   Never had endo/colon (Dr Kristie)   Noncompliance with medications    secondary to cost   Obesity 2005   negative sleep study    Palpitation     Family History  Problem Relation Age of Onset   CAD Mother        CABG and stenting x2   Hypertension Mother    Ovarian cancer Mother    Aortic stenosis Father    Heart attack Sister    Stroke Sister    Past Surgical History:  Procedure Laterality Date   WISDOM TOOTH EXTRACTION     Social History[1] Social History   Social History Narrative   Not on file     Immunization History  Administered Date(s) Administered   Tdap 05/05/2022     Objective: Vital Signs: There were no vitals taken for this visit.   Physical Exam   Musculoskeletal Exam: ***  CDAI Exam: CDAI Score: -- Patient Global: --; Provider Global: -- Swollen: --; Tender: -- Joint Exam 07/25/2024   No joint exam has been documented for this visit   There is currently no information documented on  the homunculus. Go to the Rheumatology activity and complete the homunculus joint exam.  Investigation: No additional findings.  Imaging: No results found.  Recent Labs: Lab Results  Component Value Date   WBC 8.2 09/11/2021   HGB 12.8 09/11/2021   PLT 275 09/11/2021   NA 139 09/11/2021   K 3.7 09/11/2021   CL 107 09/11/2021   CO2 25 09/11/2021   GLUCOSE 119 (H) 09/11/2021   BUN 20 09/11/2021   CREATININE 0.72 09/11/2021   BILITOT 0.6 09/11/2021   ALKPHOS 81 09/11/2021   AST 24 09/11/2021    ALT 23 09/11/2021   PROT 7.3 09/11/2021   ALBUMIN 3.8 09/11/2021   CALCIUM 8.9 09/11/2021   GFRAA 125 06/06/2013    Speciality Comments: No specialty comments available.  Procedures:  No procedures performed Allergies: Ferrous sulfate, Penicillins, and Sulfa antibiotics   Assessment / Plan:     Visit Diagnoses: No diagnosis found.  Orders: No orders of the defined types were placed in this encounter.  No orders of the defined types were placed in this encounter.   Face-to-face time spent with patient was *** minutes. Greater than 50% of time was spent in counseling and coordination of care.  Follow-Up Instructions: No follow-ups on file.   Alfonso Patterson, LPN  Note - This record has been created using [employer].  Chart creation errors have been sought, but may not always  have been located. Such creation errors do not reflect on  the standard of medical care.    [1]  Social History Tobacco Use   Smoking status: Never   Smokeless tobacco: Never  Vaping Use   Vaping status: Never Used  Substance Use Topics   Alcohol use: No   Drug use: No   "

## 2024-11-15 ENCOUNTER — Ambulatory Visit: Payer: Self-pay
